# Patient Record
Sex: Female | Born: 1955 | Race: Black or African American | Hispanic: No | Marital: Married | Smoking: Former smoker
Health system: Southern US, Community
[De-identification: ages and names within clinical notes are randomized; demographics above are authoritative.]

## PROBLEM LIST (undated history)

## (undated) DIAGNOSIS — G5600 Carpal tunnel syndrome, unspecified upper limb: Secondary | ICD-10-CM

## (undated) DIAGNOSIS — G4733 Obstructive sleep apnea (adult) (pediatric): Secondary | ICD-10-CM

## (undated) DIAGNOSIS — M67919 Unspecified disorder of synovium and tendon, unspecified shoulder: Secondary | ICD-10-CM

## (undated) DIAGNOSIS — E785 Hyperlipidemia, unspecified: Secondary | ICD-10-CM

## (undated) DIAGNOSIS — E2839 Other primary ovarian failure: Secondary | ICD-10-CM

## (undated) DIAGNOSIS — K219 Gastro-esophageal reflux disease without esophagitis: Secondary | ICD-10-CM

## (undated) DIAGNOSIS — L80 Vitiligo: Secondary | ICD-10-CM

## (undated) DIAGNOSIS — IMO0002 Reserved for concepts with insufficient information to code with codable children: Secondary | ICD-10-CM

## (undated) DIAGNOSIS — M7660 Achilles tendinitis, unspecified leg: Secondary | ICD-10-CM

## (undated) DIAGNOSIS — E119 Type 2 diabetes mellitus without complications: Secondary | ICD-10-CM

## (undated) DIAGNOSIS — I10 Essential (primary) hypertension: Secondary | ICD-10-CM

## (undated) DIAGNOSIS — E669 Obesity, unspecified: Secondary | ICD-10-CM

## (undated) HISTORY — DX: Obstructive sleep apnea (adult) (pediatric): G47.33

## (undated) HISTORY — PX: SUPRACERVICAL ABDOMINAL HYSTERECTOMY: SHX5393

## (undated) HISTORY — DX: Type 2 diabetes mellitus without complications: E11.9

## (undated) HISTORY — DX: Gastro-esophageal reflux disease without esophagitis: K21.9

## (undated) HISTORY — DX: Obesity, unspecified: E66.9

## (undated) HISTORY — DX: Hyperlipidemia, unspecified: E78.5

## (undated) HISTORY — DX: Essential (primary) hypertension: I10

## (undated) HISTORY — DX: Vitiligo: L80

## (undated) HISTORY — DX: Achilles tendinitis, unspecified leg: M76.60

## (undated) HISTORY — DX: Reserved for concepts with insufficient information to code with codable children: IMO0002

## (undated) HISTORY — DX: Carpal tunnel syndrome, unspecified upper limb: G56.00

## (undated) HISTORY — DX: Other primary ovarian failure: E28.39

## (undated) HISTORY — DX: Unspecified disorder of synovium and tendon, unspecified shoulder: M67.919

---

## 1975-09-16 HISTORY — PX: CARDIAC CATHETERIZATION: SHX172

## 2006-07-08 ENCOUNTER — Ambulatory Visit: Payer: Self-pay | Admitting: Family Medicine

## 2006-10-13 ENCOUNTER — Ambulatory Visit: Payer: Self-pay | Admitting: Family Medicine

## 2009-05-16 LAB — HM DEXA SCAN: HM Dexa Scan: NORMAL

## 2009-09-15 LAB — HM COLONOSCOPY

## 2009-10-01 ENCOUNTER — Ambulatory Visit: Payer: Self-pay | Admitting: Gastroenterology

## 2011-02-11 ENCOUNTER — Ambulatory Visit: Payer: Self-pay | Admitting: Family Medicine

## 2011-02-14 ENCOUNTER — Ambulatory Visit: Payer: Self-pay | Admitting: Family Medicine

## 2011-03-16 ENCOUNTER — Ambulatory Visit: Payer: Self-pay | Admitting: Family Medicine

## 2012-01-06 ENCOUNTER — Encounter: Payer: Self-pay | Admitting: *Deleted

## 2012-01-07 ENCOUNTER — Ambulatory Visit: Payer: Self-pay | Admitting: Cardiovascular Disease

## 2012-06-02 ENCOUNTER — Ambulatory Visit: Payer: Self-pay | Admitting: Family Medicine

## 2012-12-14 ENCOUNTER — Ambulatory Visit: Payer: Self-pay | Admitting: Family Medicine

## 2013-01-21 ENCOUNTER — Ambulatory Visit: Payer: Self-pay | Admitting: Family Medicine

## 2013-12-19 ENCOUNTER — Ambulatory Visit: Payer: Self-pay | Admitting: Family Medicine

## 2013-12-19 LAB — HM MAMMOGRAPHY: HM Mammogram: NORMAL

## 2014-01-23 ENCOUNTER — Ambulatory Visit: Payer: Self-pay | Admitting: Cardiovascular Disease

## 2014-02-03 ENCOUNTER — Ambulatory Visit (INDEPENDENT_AMBULATORY_CARE_PROVIDER_SITE_OTHER): Payer: BC Managed Care – PPO | Admitting: Cardiovascular Disease

## 2014-02-03 ENCOUNTER — Encounter (INDEPENDENT_AMBULATORY_CARE_PROVIDER_SITE_OTHER): Payer: Self-pay

## 2014-02-03 ENCOUNTER — Encounter: Payer: Self-pay | Admitting: Cardiovascular Disease

## 2014-02-03 VITALS — BP 152/90 | HR 78 | Ht 64.0 in | Wt 270.0 lb

## 2014-02-03 DIAGNOSIS — R079 Chest pain, unspecified: Secondary | ICD-10-CM

## 2014-02-03 NOTE — Patient Instructions (Signed)
Your physician recommends that you schedule a follow-up appointment in:  As needed  Your physician recommends that you continue on your current medications as directed. Please refer to the Current Medication list given to you today.  

## 2014-02-03 NOTE — Assessment & Plan Note (Signed)
Her episodes of chest pain sounds very atypical.  Her episodes only last for 2 or 3 seconds and typically happen with rest it she does not have any exertional chest pain.  I've encouraged her to start an exercise program. I've asked her to eat a better diet including a low-carb diet. I do not think that I need to see her back on her regular basis but will see her on an as-needed basis.

## 2014-02-03 NOTE — Progress Notes (Signed)
Jennifer Castro Date of Birth  1956-06-18       [facility] [address], [place], [place]  [address]   [place], [place]  [address] [place]   Fax  [phone]     Fax [phone]  Problem List: 1. Chest pain 2. Obesity ( Body mass index is 46.32 kg/(m^2).)    History of Present Illness:  Jennifer Castro is a 58 yo who is referred from Dr. Ancil Castro for CP.  Occurs with rest or activity .  She does not exercise. Not associated with deep breath.  She does think it may be related to eating.   Occurred last night after she had eaten a [employer] hamburger.    The pain will last for a few seconds - never lasted a minute.  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 She does not follow any diet.   She eats fast food frequently.    She lives in [place], [place] and works in [place] at the [employer].    Current Outpatient Prescriptions on File Prior to Visit  Medication Sig Dispense Refill  . amLODipine-atorvastatin (CADUET) 10-40 MG per tablet Take 1 tablet by mouth daily.      Marland Kitchen aspirin 81 MG tablet Take 81 mg by mouth daily.      . calcium carbonate-magnesium hydroxide (ROLAIDS) 334 MG CHEW Chew 1 tablet by mouth as needed.      . diclofenac sodium (VOLTAREN) 1 % GEL Apply topically as needed.       . Fluticasone-Salmeterol (ADVAIR) 250-50 MCG/DOSE AEPB Inhale 1 puff into the lungs as needed.       . metFORMIN (GLUCOPHAGE) 850 MG tablet Take 850 mg by mouth 2 (two) times daily.      Marland Kitchen POLYETHYLENE GLYCOL 3350 PO Take by mouth as needed. Mix with 8oz fluid.      . valsartan-hydrochlorothiazide (DIOVAN-HCT) 320-25 MG per tablet Take 1 tablet by mouth daily.       No current facility-administered medications on file prior to visit.    Allergies  Allergen Reactions  . Ace Inhibitors     Past Medical History  Diagnosis Date  . Hyperlipidemia   . Hypertension   .  Gastroesophageal reflux disease   . Asthma   . Diabetes mellitus type II   . Carpal tunnel syndrome   . Vitiligo   . Hemorrhoids   . Tendonitis, Achilles   . Disorder of rotator cuff   . OSA (obstructive sleep apnea)   . Excess or deficiency of vitamin D   . Ovarian failure   . Obesity     Past Surgical History  Procedure Laterality Date  . Supracervical abdominal hysterectomy      without BSO fibroid tumors  . Cardiac catheterization  [place]    History  Smoking status  . Never Smoker   Smokeless tobacco  . Not on file    History  Alcohol Use No    Family History  Problem Relation Age of Onset  . Heart attack Mother     Reviw of Systems:  Reviewed in the HPI.  All other systems are negative.  Physical Exam: Blood pressure 152/90, pulse 78, height 5\' 4"  (1.626 m), weight 270 lb (122.471 kg). Wt Readings from Last 3 Encounters:  02/03/14 270 lb (122.471 kg)  General: Well developed, well nourished, in no acute distress. Morbidly obese  Head: Normocephalic, atraumatic, sclera non-icteric, mucus membranes are moist,   Neck: Supple. Carotids are 2 + without bruits. No JVD   Lungs: Clear   Heart: RR, normla s1s2  Abdomen: Soft, non-tender, non-distended with normal bowel sounds.  Morbid obesity.  Msk:  Strength and tone are normal   Extremities: No clubbing or cyanosis. No edema.  Distal pedal pulses are 2+ and equal   Neuro: CN II - XII intact.  Alert and oriented X 3.   Psych:  Normal   ECG: Feb 03, 2014:  NSR at 28.  Occasional PVCs. NS ST abnormality.   Assessment / Plan:

## 2014-08-25 LAB — LIPID PANEL
Cholesterol: 171 mg/dL (ref 0–200)
HDL: 54 mg/dL (ref 35–70)
LDL Cholesterol: 109 mg/dL
TRIGLYCERIDES: 42 mg/dL (ref 40–160)

## 2014-10-24 LAB — HM PAP SMEAR: HM Pap smear: NORMAL

## 2014-12-26 LAB — HM DIABETES EYE EXAM

## 2015-02-08 LAB — HEMOGLOBIN A1C: Hgb A1c MFr Bld: 6.7 % — AB (ref 4.0–6.0)

## 2015-02-19 ENCOUNTER — Other Ambulatory Visit: Payer: Self-pay | Admitting: Family Medicine

## 2015-02-19 DIAGNOSIS — Z1231 Encounter for screening mammogram for malignant neoplasm of breast: Secondary | ICD-10-CM

## 2015-02-22 ENCOUNTER — Ambulatory Visit
Admission: RE | Admit: 2015-02-22 | Discharge: 2015-02-22 | Disposition: A | Discharge status: Home / Self Care | Payer: BLUE CROSS/BLUE SHIELD | Source: Ambulatory Visit | Attending: Family Medicine | Admitting: Family Medicine

## 2015-02-22 DIAGNOSIS — Z1231 Encounter for screening mammogram for malignant neoplasm of breast: Secondary | ICD-10-CM | POA: Insufficient documentation

## 2015-05-22 ENCOUNTER — Ambulatory Visit (INDEPENDENT_AMBULATORY_CARE_PROVIDER_SITE_OTHER): Payer: BLUE CROSS/BLUE SHIELD | Admitting: Family Medicine

## 2015-05-22 ENCOUNTER — Telehealth: Payer: Self-pay | Admitting: Family Medicine

## 2015-05-22 ENCOUNTER — Encounter: Payer: Self-pay | Admitting: Family Medicine

## 2015-05-22 VITALS — BP 158/76 | HR 84 | Temp 98.6°F | Resp 18 | Ht 64.0 in | Wt 285.5 lb

## 2015-05-22 DIAGNOSIS — K219 Gastro-esophageal reflux disease without esophagitis: Secondary | ICD-10-CM | POA: Diagnosis not present

## 2015-05-22 DIAGNOSIS — E559 Vitamin D deficiency, unspecified: Secondary | ICD-10-CM | POA: Insufficient documentation

## 2015-05-22 DIAGNOSIS — K5909 Other constipation: Secondary | ICD-10-CM | POA: Insufficient documentation

## 2015-05-22 DIAGNOSIS — J4541 Moderate persistent asthma with (acute) exacerbation: Secondary | ICD-10-CM | POA: Diagnosis not present

## 2015-05-22 DIAGNOSIS — G4733 Obstructive sleep apnea (adult) (pediatric): Secondary | ICD-10-CM | POA: Insufficient documentation

## 2015-05-22 DIAGNOSIS — J069 Acute upper respiratory infection, unspecified: Secondary | ICD-10-CM

## 2015-05-22 DIAGNOSIS — L80 Vitiligo: Secondary | ICD-10-CM | POA: Insufficient documentation

## 2015-05-22 DIAGNOSIS — E1142 Type 2 diabetes mellitus with diabetic polyneuropathy: Secondary | ICD-10-CM

## 2015-05-22 DIAGNOSIS — I10 Essential (primary) hypertension: Secondary | ICD-10-CM | POA: Diagnosis not present

## 2015-05-22 DIAGNOSIS — J4520 Mild intermittent asthma, uncomplicated: Secondary | ICD-10-CM | POA: Insufficient documentation

## 2015-05-22 DIAGNOSIS — E785 Hyperlipidemia, unspecified: Secondary | ICD-10-CM | POA: Diagnosis not present

## 2015-05-22 DIAGNOSIS — E1140 Type 2 diabetes mellitus with diabetic neuropathy, unspecified: Secondary | ICD-10-CM | POA: Insufficient documentation

## 2015-05-22 LAB — POCT GLYCOSYLATED HEMOGLOBIN (HGB A1C): Hemoglobin A1C: 6.7

## 2015-05-22 MED ORDER — BENZONATATE 100 MG PO CAPS: 100.0000 mg | ORAL_CAPSULE | Freq: Three times a day (TID) | ORAL | Status: DC | PRN

## 2015-05-22 MED ORDER — PREDNISONE 20 MG PO TABS: 20.0000 mg | ORAL_TABLET | Freq: Every day | ORAL | Status: DC

## 2015-05-22 NOTE — Telephone Encounter (Signed)
Is this correct? 

## 2015-05-22 NOTE — Telephone Encounter (Signed)
Patient was seen today and was told by dr Ancil Boozer to take a couple of days off of work. She is requesting a work note and will pick it up when she receive the call that it is ready.

## 2015-05-22 NOTE — Progress Notes (Signed)
Name: Jennifer Castro   MRN: [id number]    DOB: June 07, 1956   Date:05/22/2015       Progress Note  Subjective  Chief Complaint  Chief Complaint  Patient presents with  . Medication Refill    3 month F/U  . Hypertension    Edema in right ankle.  Marland Kitchen URI    Onset-Friday, Fatigue, Dizziness, Headache, Cold chills, Horsenesss, Sore Throat, Body Aches. Has been taking OTC Cold and Sinus and Alka-Seltzer Plus, and symptoms are worsen.    . Diabetes    Checks BS once daily every other day, Low-120's Average-146 High-178  . Constipation    Has been improving, drinking alot of water and eating fiber (fruit)    HPI  HTN: bp is high today, she has been sick with an URI, not sleeping well. BP is usually within normal limits  DM II with neuropathy: she has been taking Metformin and aspirin daily, she has been sucking on Halls for her cough, and glucose went up to 170's but usually below 130.  Denies polyphagia, or polydipsia. She states about one week ago she was having polyuria but it has improved now. Feet pain is still daily but not all day like it used to be  Hyperlipidemia: taking Caduet and denies side effects  URI: feeling sick for the past 6 days. Went to work today, but hoarseness, fatigue and facial pressure and SOB was bothering her all day. Initially sputum was green and now is yellow. No fever  Asthma with acute exacerbation: she was doing well on Advair, but since her URI, she has noticed SOB, cough daily and also wheezing that is worse at night. She has been using rescue inhaler every day for the past 6 days.   GERD: under control  Obesity: gained weight since last visit, states drinking more sweet beverages lately, but will try to go back to water and have better food choices   Patient Active Problem List   Diagnosis Date Noted  . Hypertension, benign 05/22/2015  . Hyperlipidemia 05/22/2015  . GERD (gastroesophageal reflux disease) 05/22/2015  . Diabetes mellitus with neuropathy  05/22/2015  . Moderate persistent asthma with exacerbation 05/22/2015  . OSA (obstructive sleep apnea) 05/22/2015  . Obesity, Class III, BMI 40-49.9 (morbid obesity) 05/22/2015  . Vitamin D deficiency 05/22/2015  . Chronic constipation 05/22/2015  . Vitiligo 05/22/2015  . Chest pain 02/03/2014    Past Surgical History  Procedure Laterality Date  . Supracervical abdominal hysterectomy      without BSO fibroid tumors  . Cardiac catheterization  [place]    Family History  Problem Relation Age of Onset  . Heart attack Mother     Social History   Social History  . Marital Status: Married    Spouse Name: N/A  . Number of Children: N/A  . Years of Education: N/A   Occupational History  . Not on file.   Social History Main Topics  . Smoking status: Former Smoker -- 0.50 packs/day for 10 years    Types: Cigarettes    Start date: 09/15/1969    Quit date: 05/21/1980  . Smokeless tobacco: Never Used  . Alcohol Use: No  . Drug Use: No  . Sexual Activity: Not Currently   Other Topics Concern  . Not on file   Social History Narrative     Current outpatient prescriptions:  .  amLODipine-atorvastatin (CADUET) 10-40 MG per tablet, Take 1 tablet by mouth daily., Disp: , Rfl:  .  aspirin 81 MG tablet, Take 81 mg by mouth daily., Disp: , Rfl:  .  calcium carbonate-magnesium hydroxide (ROLAIDS) 334 MG CHEW, Chew 1 tablet by mouth as needed., Disp: , Rfl:  .  diclofenac sodium (VOLTAREN) 1 % GEL, Apply topically as needed. , Disp: , Rfl:  .  Fluticasone-Salmeterol (ADVAIR) 250-50 MCG/DOSE AEPB, Inhale 1 puff into the lungs as needed. , Disp: , Rfl:  .  metFORMIN (GLUCOPHAGE) 850 MG tablet, Take 850 mg by mouth 2 (two) times daily., Disp: , Rfl:  .  POLYETHYLENE GLYCOL 3350 PO, Take by mouth as needed. Mix with 8oz fluid., Disp: , Rfl:  .  valsartan-hydrochlorothiazide (DIOVAN-HCT) 320-25 MG per tablet, Take 1 tablet by mouth daily., Disp: , Rfl:  .  VITAMIN D,  CHOLECALCIFEROL, PO, Take by mouth daily., Disp: , Rfl:  .  benzonatate (TESSALON) 100 MG capsule, Take 1 capsule (100 mg total) by mouth 3 (three) times daily as needed for cough., Disp: 40 capsule, Rfl: 0 .  predniSONE (DELTASONE) 20 MG tablet, Take 1 tablet (20 mg total) by mouth daily with breakfast., Disp: 10 tablet, Rfl: 0  Allergies  Allergen Reactions  . Ace Inhibitors      ROS  Constitutional: Negative for fever positiive for weight change.  Respiratory: Positive for cough and shortness of breath.   Cardiovascular: Negative for chest pain or palpitations.  Gastrointestinal: Negative for abdominal pain, no bowel changes.  Musculoskeletal: Negative for gait problem or joint swelling.  Skin: Negative for rash.  Neurological: Negative for dizziness or headache.  No other specific complaints in a complete review of systems (except as listed in HPI above).  Objective  Filed Vitals:   05/22/15 1517  BP: 158/76  Pulse: 84  Temp: 98.6 F (37 C)  TempSrc: Oral  Resp: 18  Height: 5\' 4"  (1.626 m)  Weight: 285 lb 8 oz (129.502 kg)  SpO2: 95%    Body mass index is 48.98 kg/(m^2).  Physical Exam  Constitutional: Patient appears well-developed Obese No distress.  HEENT: head atraumatic, normocephalic, pupils equal and reactive to light, ears norma TM, neck supple, white postnasal drainage, tender to palpation of frontal sinus Cardiovascular: Normal rate, regular rhythm and normal heart sounds.  No murmur heard. No BLE edema. Pulmonary/Chest: Effort normal and breath sounds normal. No respiratory distress. Abdominal: Soft.  There is no tenderness. Psychiatric: Patient has a normal mood and affect. behavior is normal. Judgment and thought content normal.   Diabetic Foot Exam: Diabetic Foot Exam - Simple   Simple Foot Form  Visual Inspection  No deformities, no ulcerations, no other skin breakdown bilaterally:  Yes  Sensation Testing  Intact to touch and monofilament  testing bilaterally:  Yes  Pulse Check  Posterior Tibialis and Dorsalis pulse intact bilaterally:  Yes  Comments      PHQ2/9: Depression screen PHQ 2/9 05/22/2015  Decreased Interest 0  Down, Depressed, Hopeless 0  PHQ - 2 Score 0     Fall Risk: Fall Risk  05/22/2015  Falls in the past year? No      Assessment & Plan   1. Type 2 diabetes mellitus with polyneuropathy Discussed importance of diet and exercise. Explained prednisone will make glucose go up, needs to be very strict with her diet over the next week   2. Hypertension, benign Elevated today, avoid otc cold and cough medication, continue bp medication  3. Hyperlipidemia Recheck next visit  4. Gastroesophageal reflux disease without esophagitis controlled  5. Moderate persistent asthma  with exacerbation Flare from URI  - benzonatate (TESSALON) 100 MG capsule; Take 1 capsule (100 mg total) by mouth 3 (three) times daily as needed for cough.  Dispense: 40 capsule; Refill: 0 - predniSONE (DELTASONE) 20 MG tablet; Take 1 tablet (20 mg total) by mouth daily with breakfast.  Dispense: 10 tablet; Refill:   6. Obesity, Class III, BMI 40-49.9 (morbid obesity) Discussed with the patient the risk posed by an increased BMI. Discussed importance of portion control, calorie counting and at least 150 minutes of physical activity weekly. Avoid sweet beverages and drink more water. Eat at least 6 servings of fruit and vegetables daily   7. Vitamin D deficiency Continue otc supplementation   8. Upper respiratory infection Saline spray, time off work, rest, fluids, and medication as written, call back if no improvement and worsening of symptoms for antibiotic prescription  -Tessalon perles 100 mg #40  - benzonatate (TESSALON) 100 MG capsule; Take 1 capsule (100 mg total) by mouth 3 (three) times daily as needed for cough.  Dispense: 40 capsule; Refill: 0 - predniSONE (DELTASONE) 20 MG tablet; Take 1 tablet (20 mg total) by mouth  daily with breakfast.  Dispense: 10 tablet; Refill:

## 2015-05-22 NOTE — Telephone Encounter (Signed)
Yes. She can stay out Wednesday and Thursday

## 2015-05-23 NOTE — Telephone Encounter (Signed)
Called left vm work letter ready to pick up

## 2015-06-26 ENCOUNTER — Telehealth: Payer: Self-pay | Admitting: Family Medicine

## 2015-06-26 NOTE — Telephone Encounter (Signed)
Was told to take predinose for 3-4 days (from a different time-it hasnt been over 30 days), she have 6 pills left. She has the same symptoms as before and would like to know if she is able to finish out that medications? It is okay to leave detailed message [phone]

## 2015-08-15 ENCOUNTER — Ambulatory Visit (INDEPENDENT_AMBULATORY_CARE_PROVIDER_SITE_OTHER): Payer: BLUE CROSS/BLUE SHIELD | Admitting: Family Medicine

## 2015-08-15 ENCOUNTER — Encounter: Payer: Self-pay | Admitting: Family Medicine

## 2015-08-15 VITALS — BP 132/84 | HR 87 | Temp 98.6°F | Resp 16 | Wt 288.7 lb

## 2015-08-15 DIAGNOSIS — J309 Allergic rhinitis, unspecified: Secondary | ICD-10-CM

## 2015-08-15 DIAGNOSIS — J3089 Other allergic rhinitis: Secondary | ICD-10-CM | POA: Insufficient documentation

## 2015-08-15 DIAGNOSIS — J069 Acute upper respiratory infection, unspecified: Secondary | ICD-10-CM

## 2015-08-15 DIAGNOSIS — R0609 Other forms of dyspnea: Secondary | ICD-10-CM | POA: Diagnosis not present

## 2015-08-15 DIAGNOSIS — E785 Hyperlipidemia, unspecified: Secondary | ICD-10-CM

## 2015-08-15 DIAGNOSIS — J4541 Moderate persistent asthma with (acute) exacerbation: Secondary | ICD-10-CM

## 2015-08-15 DIAGNOSIS — I10 Essential (primary) hypertension: Secondary | ICD-10-CM

## 2015-08-15 DIAGNOSIS — E559 Vitamin D deficiency, unspecified: Secondary | ICD-10-CM

## 2015-08-15 MED ORDER — MONTELUKAST SODIUM 10 MG PO TABS: 10.0000 mg | ORAL_TABLET | Freq: Every day | ORAL | Status: DC

## 2015-08-15 MED ORDER — PREDNISONE 20 MG PO TABS: 20.0000 mg | ORAL_TABLET | Freq: Every day | ORAL | Status: DC

## 2015-08-15 MED ORDER — FLUTICASONE PROPIONATE 50 MCG/ACT NA SUSP: 2.0000 | Freq: Every day | NASAL | Status: DC

## 2015-08-15 MED ORDER — ALBUTEROL SULFATE HFA 108 (90 BASE) MCG/ACT IN AERS: 2.0000 | INHALATION_SPRAY | Freq: Four times a day (QID) | RESPIRATORY_TRACT | Status: AC | PRN

## 2015-08-15 MED ORDER — PREDNISONE 20 MG PO TABS: 20.0000 mg | ORAL_TABLET | Freq: Two times a day (BID) | ORAL | Status: DC

## 2015-08-15 MED ORDER — BENZONATATE 100 MG PO CAPS: 100.0000 mg | ORAL_CAPSULE | Freq: Three times a day (TID) | ORAL | Status: DC | PRN

## 2015-08-15 NOTE — Progress Notes (Signed)
Name: Jennifer Castro   MRN: [id number]    DOB: [birth year]   Date:08/15/2015       Progress Note  Subjective  Chief Complaint  Chief Complaint  Patient presents with  . URI    ongoing several months.  having cough, sob, wheezing, headache, and loss of voice    HPI  URI/AR: she states that for the past few months she has constant nasal congestion, clear rhinorrhea, hoarseness, and worsening of her cough. Intermittent episodes of SOB  AR: she states that for most of the year she has noticed nasal congestion, sneezing and clear rhinorrhea.  Asthma moderate persistent with exacerbation: she is compliant with her medication and has increased the use of Ventolin for the past week. She states the cough is currently dry, waking up during the night because of the cough. SOB intermittently.    Patient Active Problem List   Diagnosis Date Noted  . Hypertension, benign 05/22/2015  . Hyperlipidemia 05/22/2015  . GERD (gastroesophageal reflux disease) 05/22/2015  . Diabetes mellitus with neuropathy ([place]) 05/22/2015  . Moderate persistent asthma with exacerbation 05/22/2015  . OSA (obstructive sleep apnea) 05/22/2015  . Obesity, Class III, BMI 40-49.9 (morbid obesity) ([place]) 05/22/2015  . Vitamin D deficiency 05/22/2015  . Chronic constipation 05/22/2015  . Vitiligo 05/22/2015  . Chest pain 02/03/2014    Past Surgical History  Procedure Laterality Date  . Supracervical abdominal hysterectomy      without BSO fibroid tumors  . Cardiac catheterization  [place]    Family History  Problem Relation Age of Onset  . Heart attack Mother     Social History   Social History  . Marital Status: Married    Spouse Name: N/A  . Number of Children: N/A  . Years of Education: N/A   Occupational History  . Not on file.   Social History Main Topics  . Smoking status: Former Smoker -- 0.50 packs/day for 10 years    Types: Cigarettes    Start date: 09/15/1969    Quit date:  05/21/1980  . Smokeless tobacco: Never Used  . Alcohol Use: No  . Drug Use: No  . Sexual Activity: Not Currently   Other Topics Concern  . Not on file   Social History Narrative     Current outpatient prescriptions:  .  amLODipine-atorvastatin (CADUET) 10-40 MG per tablet, Take 1 tablet by mouth daily., Disp: , Rfl:  .  aspirin 81 MG tablet, Take 81 mg by mouth daily., Disp: , Rfl:  .  benzonatate (TESSALON) 100 MG capsule, Take 1 capsule (100 mg total) by mouth 3 (three) times daily as needed for cough., Disp: 40 capsule, Rfl: 0 .  calcium carbonate-magnesium hydroxide (ROLAIDS) 334 MG CHEW, Chew 1 tablet by mouth as needed., Disp: , Rfl:  .  diclofenac sodium (VOLTAREN) 1 % GEL, Apply topically as needed. , Disp: , Rfl:  .  Fluticasone-Salmeterol (ADVAIR) 250-50 MCG/DOSE AEPB, Inhale 1 puff into the lungs as needed. , Disp: , Rfl:  .  metFORMIN (GLUCOPHAGE) 850 MG tablet, Take 850 mg by mouth 2 (two) times daily., Disp: , Rfl:  .  POLYETHYLENE GLYCOL 3350 PO, Take by mouth as needed. Mix with 8oz fluid., Disp: , Rfl:  .  predniSONE (DELTASONE) 20 MG tablet, Take 1 tablet (20 mg total) by mouth daily with breakfast., Disp: 10 tablet, Rfl: 0 .  valsartan-hydrochlorothiazide (DIOVAN-HCT) 320-25 MG per tablet, Take 1 tablet by mouth daily., Disp: , Rfl:  .  VITAMIN D, CHOLECALCIFEROL, PO, Take by mouth daily., Disp: , Rfl:   Allergies  Allergen Reactions  . Ace Inhibitors      ROS  Constitutional: Negative for fever or weight change.  Respiratory: Positive  for cough and shortness of breath.   Cardiovascular: Positive  for chest pain intermittent with the severe cough, and  Palpitations intermittently .  Gastrointestinal: Negative for abdominal pain, no bowel changes.  Musculoskeletal: Positive  for gait problem - from heel spurs - going to see podiatrist or joint swelling.  Skin: Negative for rash.  Neurological: Nepisodes of headache and dizziness with cough .  No other  specific complaints in a complete review of systems (except as listed in HPI above).  Objective  Filed Vitals:   08/15/15 1104  BP: 132/84  Pulse: 87  Temp: 98.6 F (37 C)  TempSrc: Oral  Resp: 16  Weight: 288 lb 11.2 oz (130.953 kg)  SpO2: 99%    Body mass index is 49.53 kg/(m^2).  Physical Exam  Constitutional: Patient appears well-developed and well-nourished. Obese  No distress.  HEENT: head atraumatic, normocephalic,boggy turbinates with clear rhinorrhea,  pupils equal and reactive to light, ears TM normal, neck supple, throat within normal limits Cardiovascular: Normal rate, regular rhythm and normal heart sounds.  No murmur heard. 1 plus  BLE edema. Pulmonary/Chest: Effort normal and breath sounds normal. No respiratory distress. Abdominal: Soft.  There is no tenderness. Psychiatric: Patient has a normal mood and affect. behavior is normal. Judgment and thought content normal.  Recent Results (from the past 2160 hour(s))  POCT HgB A1C     Status: None   Collection Time: 05/22/15  4:06 PM  Result Value Ref Range   Hemoglobin A1C 6.7     PHQ2/9: Depression screen [facility] 2/9 08/15/2015 05/22/2015  Decreased Interest 0 0  Down, Depressed, Hopeless 0 0  PHQ - 2 Score 0 0    Fall Risk: Fall Risk  08/15/2015 05/22/2015  Falls in the past year? No No    Functional Status Survey: Is the patient deaf or have difficulty hearing?: No Does the patient have difficulty seeing, even when wearing glasses/contacts?: Yes (glasses) Does the patient have difficulty concentrating, remembering, or making decisions?: No Does the patient have difficulty walking or climbing stairs?: No Does the patient have difficulty dressing or bathing?: No Does the patient have difficulty doing errands alone such as visiting a doctor's office or shopping?: No    Assessment & Plan  1. Moderate persistent asthma with exacerbation  I will refer her to Allergist if no improvement of symptoms.  -  albuterol (PROVENTIL HFA;VENTOLIN HFA) 108 (90 BASE) MCG/ACT inhaler; Inhale 2 puffs into the lungs every 6 (six) hours as needed for wheezing or shortness of breath.  Dispense: 1 Inhaler; Refill: 0 - montelukast (SINGULAIR) 10 MG tablet; Take 1 tablet (10 mg total) by mouth at bedtime.  Dispense: 30 tablet; Refill: 5 - predniSONE (DELTASONE) 20 MG tablet; Take 1 tablet (20 mg total) by mouth 2 (two) times daily with a meal.  Dispense: 10 tablet; Refill: 0  2. Upper respiratory infection  - benzonatate (TESSALON) 100 MG capsule; Take 1 capsule (100 mg total) by mouth 3 (three) times daily as needed for cough.  Dispense: 40 capsule; Refill: 0  3. Vitamin D deficiency  - VITAMIN D 25 Hydroxy (Vit-D Deficiency, Fractures)  4. Hyperlipidemia  - Lipid panel  5. Hypertension, benign  - Comprehensive metabolic panel  6. Perennial allergic rhinitis  - fluticasone (  FLONASE) 50 MCG/ACT nasal spray; Place 2 sprays into both nostrils daily.  Dispense: 16 g; Refill: 6 - montelukast (SINGULAIR) 10 MG tablet; Take 1 tablet (10 mg total) by mouth at bedtime.  Dispense: 30 tablet; Refill: 5  7. Dyspnea on exertion  - B Nat Peptide We will get bnap, because of her risk factor to make sure not in CHF

## 2015-08-16 ENCOUNTER — Other Ambulatory Visit: Payer: Self-pay | Admitting: Family Medicine

## 2015-08-16 DIAGNOSIS — E559 Vitamin D deficiency, unspecified: Secondary | ICD-10-CM

## 2015-08-16 LAB — COMPREHENSIVE METABOLIC PANEL
ALBUMIN: 3.9 g/dL (ref 3.6–4.8)
ALK PHOS: 83 IU/L (ref 39–117)
ALT: 14 IU/L (ref 0–32)
AST: 14 IU/L (ref 0–40)
Albumin/Globulin Ratio: 1.2 (ref 1.1–2.5)
BILIRUBIN TOTAL: 0.4 mg/dL (ref 0.0–1.2)
BUN / CREAT RATIO: 16 (ref 11–26)
BUN: 10 mg/dL (ref 8–27)
CO2: 28 mmol/L (ref 18–29)
CREATININE: 0.64 mg/dL (ref 0.57–1.00)
Calcium: 9.3 mg/dL (ref 8.7–10.3)
Chloride: 100 mmol/L (ref 97–106)
GFR calc Af Amer: 112 mL/min/{1.73_m2} (ref >59)
GFR, EST NON AFRICAN AMERICAN: 97 mL/min/{1.73_m2} (ref >59)
GLOBULIN, TOTAL: 3.2 g/dL (ref 1.5–4.5)
GLUCOSE: 119 mg/dL — AB (ref 65–99)
Potassium: 4.1 mmol/L (ref 3.5–5.2)
SODIUM: 140 mmol/L (ref 136–144)
Total Protein: 7.1 g/dL (ref 6.0–8.5)

## 2015-08-16 LAB — LIPID PANEL
Chol/HDL Ratio: 3.8 ratio units (ref 0.0–4.4)
Cholesterol, Total: 181 mg/dL (ref 100–199)
HDL: 48 mg/dL (ref >39)
LDL CALC: 122 mg/dL — AB (ref 0–99)
Triglycerides: 54 mg/dL (ref 0–149)
VLDL CHOLESTEROL CAL: 11 mg/dL (ref 5–40)

## 2015-08-16 LAB — BRAIN NATRIURETIC PEPTIDE: BNP: 21.7 pg/mL (ref 0.0–100.0)

## 2015-08-16 LAB — VITAMIN D 25 HYDROXY (VIT D DEFICIENCY, FRACTURES): Vit D, 25-Hydroxy: 16.4 ng/mL — ABNORMAL LOW (ref 30.0–100.0)

## 2015-08-16 MED ORDER — VITAMIN D (ERGOCALCIFEROL) 1.25 MG (50000 UNIT) PO CAPS: 50000.0000 [IU] | ORAL_CAPSULE | ORAL | Status: DC

## 2015-08-22 ENCOUNTER — Ambulatory Visit (INDEPENDENT_AMBULATORY_CARE_PROVIDER_SITE_OTHER): Payer: BLUE CROSS/BLUE SHIELD | Admitting: Family Medicine

## 2015-08-22 ENCOUNTER — Encounter: Payer: Self-pay | Admitting: Family Medicine

## 2015-08-22 VITALS — BP 138/78 | HR 95 | Temp 97.7°F | Resp 16 | Ht 64.0 in | Wt 283.5 lb

## 2015-08-22 DIAGNOSIS — E785 Hyperlipidemia, unspecified: Secondary | ICD-10-CM

## 2015-08-22 DIAGNOSIS — R809 Proteinuria, unspecified: Secondary | ICD-10-CM

## 2015-08-22 DIAGNOSIS — Z1211 Encounter for screening for malignant neoplasm of colon: Secondary | ICD-10-CM | POA: Diagnosis not present

## 2015-08-22 DIAGNOSIS — E1142 Type 2 diabetes mellitus with diabetic polyneuropathy: Secondary | ICD-10-CM

## 2015-08-22 DIAGNOSIS — G4733 Obstructive sleep apnea (adult) (pediatric): Secondary | ICD-10-CM

## 2015-08-22 DIAGNOSIS — I10 Essential (primary) hypertension: Secondary | ICD-10-CM

## 2015-08-22 DIAGNOSIS — J4520 Mild intermittent asthma, uncomplicated: Secondary | ICD-10-CM | POA: Diagnosis not present

## 2015-08-22 DIAGNOSIS — E1129 Type 2 diabetes mellitus with other diabetic kidney complication: Secondary | ICD-10-CM | POA: Diagnosis not present

## 2015-08-22 DIAGNOSIS — E66813 Obesity, class 3: Secondary | ICD-10-CM

## 2015-08-22 DIAGNOSIS — IMO0001 Reserved for inherently not codable concepts without codable children: Secondary | ICD-10-CM

## 2015-08-22 DIAGNOSIS — E559 Vitamin D deficiency, unspecified: Secondary | ICD-10-CM | POA: Diagnosis not present

## 2015-08-22 LAB — POCT GLYCOSYLATED HEMOGLOBIN (HGB A1C): HEMOGLOBIN A1C: 6.8

## 2015-08-22 LAB — POCT UA - MICROALBUMIN: MICROALBUMIN (UR) POC: 50 mg/L

## 2015-08-22 NOTE — Progress Notes (Addendum)
Name: Jennifer Castro   MRN: [id number]    DOB: 1956/05/02   Date:08/22/2015       Progress Note  Subjective  Chief Complaint  Chief Complaint  Patient presents with  . Medication Refill    follow-up  . Hypertension    headache may be due to prednisone  . Diabetes  . Asthma    HPI  Asthma moderate Intermittent: she has episodes intermittently, this past year had to take prednisone three times for acute exacerbation during an URI.  Usually only uses rescue inhaler when exposed to fumes or strong odor. Currently taking prednisone. Today would be the 4 th day, but she is feeling much better and we will stop medication. Continue  Advair for at least another week to make sure symptoms completely resolved.    08/22/15 0839  Asthma History  Symptoms 0-2 days/week  Nighttime Awakenings 0-2/month  Asthma interference with normal activity Minor limitations  SABA use (not for EIB) 0-2 days/wk  Risk: Exacerbations requiring oral systemic steroids 2 or more / year  Asthma Severity Intermittent   HTN: she has been back on Caduet daily, but was skipping doses before. Taking Valsartan daily. BP is at goal. Explained importance of compliance with medication. No chest pain or palpitation - except during coughing spells last week.   DM: she has neuropathy and also proteinuria. On ARB, paresthesia on her right foot has improved. She is on Metformin daily. No polyphagia, polydipsia or polyuria. HgbA1C is at goal at 6.8%.   Morbid Obesity: she has lost 6 lbs since last visit one week ago, she states prednisone made her feel jittery and a little nauseated and she has not been eating as much. Advised her to keep weight down.    Patient Active Problem List   Diagnosis Date Noted  . Microalbuminuria due to type 2 diabetes mellitus ([place]) 08/22/2015  . Perennial allergic rhinitis 08/15/2015  . Hypertension, benign 05/22/2015  . Hyperlipidemia 05/22/2015  . GERD (gastroesophageal reflux disease) 05/22/2015   . Diabetes mellitus with neuropathy ([place]) 05/22/2015  . Moderate intermittent asthma 05/22/2015  . OSA (obstructive sleep apnea) 05/22/2015  . Obesity, Class III, BMI 40-49.9 (morbid obesity) ([place]) 05/22/2015  . Vitamin D deficiency 05/22/2015  . Chronic constipation 05/22/2015  . Vitiligo 05/22/2015    Past Surgical History  Procedure Laterality Date  . Supracervical abdominal hysterectomy      without BSO fibroid tumors  . Cardiac catheterization  [place]    Family History  Problem Relation Age of Onset  . Heart attack Mother     Social History   Social History  . Marital Status: Married    Spouse Name: N/A  . Number of Children: N/A  . Years of Education: N/A   Occupational History  . Not on file.   Social History Main Topics  . Smoking status: Former Smoker -- 0.50 packs/day for 10 years    Types: Cigarettes    Start date: 09/15/1969    Quit date: 05/21/1980  . Smokeless tobacco: Never Used  . Alcohol Use: No  . Drug Use: No  . Sexual Activity: Not Currently   Other Topics Concern  . Not on file   Social History Narrative     Current outpatient prescriptions:  .  albuterol (PROVENTIL HFA;VENTOLIN HFA) 108 (90 BASE) MCG/ACT inhaler, Inhale 2 puffs into the lungs every 6 (six) hours as needed for wheezing or shortness of breath., Disp: 1 Inhaler, Rfl: 0 .  amLODipine-atorvastatin (CADUET) 10-40 MG per tablet, Take 1 tablet by mouth daily., Disp: , Rfl:  .  aspirin 81 MG tablet, Take 81 mg by mouth daily., Disp: , Rfl:  .  benzonatate (TESSALON) 100 MG capsule, Take 1 capsule (100 mg total) by mouth 3 (three) times daily as needed for cough., Disp: 40 capsule, Rfl: 0 .  calcium carbonate-magnesium hydroxide (ROLAIDS) 334 MG CHEW, Chew 1 tablet by mouth as needed., Disp: , Rfl:  .  diclofenac sodium (VOLTAREN) 1 % GEL, Apply topically as needed. , Disp: , Rfl:  .  fluticasone (FLONASE) 50 MCG/ACT nasal spray, Place 2 sprays into both nostrils  daily., Disp: 16 g, Rfl: 6 .  Fluticasone-Salmeterol (ADVAIR) 250-50 MCG/DOSE AEPB, Inhale 1 puff into the lungs as needed. , Disp: , Rfl:  .  metFORMIN (GLUCOPHAGE) 850 MG tablet, Take 850 mg by mouth 2 (two) times daily., Disp: , Rfl:  .  montelukast (SINGULAIR) 10 MG tablet, Take 1 tablet (10 mg total) by mouth at bedtime., Disp: 30 tablet, Rfl: 5 .  POLYETHYLENE GLYCOL 3350 PO, Take by mouth as needed. Mix with 8oz fluid., Disp: , Rfl:  .  predniSONE (DELTASONE) 20 MG tablet, Take 1 tablet (20 mg total) by mouth 2 (two) times daily with a meal., Disp: 10 tablet, Rfl: 0 .  valsartan-hydrochlorothiazide (DIOVAN-HCT) 320-25 MG per tablet, Take 1 tablet by mouth daily., Disp: , Rfl:  .  VITAMIN D, CHOLECALCIFEROL, PO, Take by mouth daily., Disp: , Rfl:  .  Vitamin D, Ergocalciferol, (DRISDOL) 50000 UNITS CAPS capsule, Take 1 capsule (50,000 Units total) by mouth every 7 (seven) days., Disp: 12 capsule, Rfl: 0  Allergies  Allergen Reactions  . Ace Inhibitors      ROS  Constitutional: Negative for fever , positive for  weight change - loss.  Respiratory: Negative for cough and shortness of breath.   Cardiovascular: Negative for chest pain or palpitations.  Gastrointestinal: Negative for abdominal pain, no bowel changes.  Musculoskeletal: Negative for gait problem or joint swelling.  Skin: Negative for rash.  Neurological: Negative for dizziness or headache.  No other specific complaints in a complete review of systems (except as listed in HPI above).  Objective  Filed Vitals:   08/22/15 0824  BP: 138/78  Pulse: 95  Temp: 97.7 F (36.5 C)  TempSrc: Oral  Resp: 16  Height: 5\' 4"  (1.626 m)  Weight: 283 lb 8 oz (128.595 kg)  SpO2: 98%    Body mass index is 48.64 kg/(m^2).  Physical Exam  Constitutional: Patient appears well-developed and well-nourished. Obese  No distress.  HEENT: head atraumatic, normocephalic, pupils equal and reactive to light,  neck supple, throat within  normal limits Cardiovascular: Normal rate, regular rhythm and normal heart sounds.  No murmur heard. No BLE edema. Pulmonary/Chest: Effort normal and breath sounds normal. No respiratory distress. Abdominal: Soft.  There is no tenderness. Psychiatric: Patient has a normal mood and affect. behavior is normal. Judgment and thought content normal.  Recent Results (from the past 2160 hour(s))  Lipid panel     Status: Abnormal   Collection Time: 08/15/15 11:46 AM  Result Value Ref Range   Cholesterol, Total 181 100 - 199 mg/dL   Triglycerides 54 0 - 149 mg/dL   HDL 48 >39 mg/dL   VLDL Cholesterol Cal 11 5 - 40 mg/dL   LDL Calculated 122 (H) 0 - 99 mg/dL   Chol/HDL Ratio 3.8 0.0 - 4.4 ratio units    Comment:  T. Chol/HDL Ratio                                             Men  Women                               1/2 Avg.Risk  3.4    3.3                                   Avg.Risk  5.0    4.4                                2X Avg.Risk  9.6    7.1                                3X Avg.Risk 23.4   11.0   Comprehensive metabolic panel     Status: Abnormal   Collection Time: 08/15/15 11:46 AM  Result Value Ref Range   Glucose 119 (H) 65 - 99 mg/dL   BUN 10 8 - 27 mg/dL   Creatinine, Ser 0.64 0.57 - 1.00 mg/dL   GFR calc non Af Amer 97 >59 mL/min/1.73   GFR calc Af Amer 112 >59 mL/min/1.73   BUN/Creatinine Ratio 16 11 - 26   Sodium 140 136 - 144 mmol/L    Comment: **Effective August 27, 2015 the reference interval**   for Sodium, Serum will be changing to:                                             134 - 144    Potassium 4.1 3.5 - 5.2 mmol/L   Chloride 100 97 - 106 mmol/L    Comment: **Effective August 27, 2015 the reference interval**   for Chloride, Serum will be changing to:                                              96 - 106    CO2 28 18 - 29 mmol/L   Calcium 9.3 8.7 - 10.3 mg/dL   Total Protein 7.1 6.0 - 8.5 g/dL   Albumin 3.9 3.6 - 4.8 g/dL    Globulin, Total 3.2 1.5 - 4.5 g/dL   Albumin/Globulin Ratio 1.2 1.1 - 2.5   Bilirubin Total 0.4 0.0 - 1.2 mg/dL   Alkaline Phosphatase 83 39 - 117 IU/L   AST 14 0 - 40 IU/L   ALT 14 0 - 32 IU/L  VITAMIN D 25 Hydroxy (Vit-D Deficiency, Fractures)     Status: Abnormal   Collection Time: 08/15/15 11:46 AM  Result Value Ref Range   Vit D, 25-Hydroxy 16.4 (L) 30.0 - 100.0 ng/mL    Comment: Vitamin D deficiency has been defined by the [place] and an Endocrine Society practice guideline as a level of serum 25-OH vitamin D less than 20 ng/mL (1,2). The Endocrine  Society went on to further define vitamin D insufficiency as a level between 21 and 29 ng/mL (2). 1. IOM (Institute of Medicine). 2010. Dietary reference    intakes for calcium and D. [place]: The    [employer]. 2. Holick MF, Binkley [place], Bischoff-Ferrari HA, et al.    Evaluation, treatment, and prevention of vitamin D    deficiency: an Endocrine Society clinical practice    guideline. JCEM. 2011 Jul; 96(7):1911-30.   Brain natriuretic peptide     Status: None   Collection Time: 08/15/15 11:46 AM  Result Value Ref Range   BNP 21.7 0.0 - 100.0 pg/mL  POCT UA - Microalbumin     Status: Abnormal   Collection Time: 08/22/15  8:34 AM  Result Value Ref Range   Microalbumin Ur, POC 50 mg/L   Creatinine, POC  mg/dL   Albumin/Creatinine Ratio, Urine, POC     PHQ2/9: Depression screen [facility] 2/9 08/15/2015 05/22/2015  Decreased Interest 0 0  Down, Depressed, Hopeless 0 0  PHQ - 2 Score 0 0     Fall Risk: Fall Risk  08/15/2015 05/22/2015  Falls in the past year? No No    Assessment & Plan  1. Diabetic polyneuropathy associated with type 2 diabetes mellitus ([place])  Discussed importance of taking medications daily, and following a diet - POCT HgB A1C - POCT UA - Microalbumin  2. Microalbuminuria due to type 2 diabetes mellitus ([facility])  Continue ARB  3. Hypertension, benign  She has medication  at home, she will call when she needs medication refills  4. Moderate intermittent asthma, uncomplicated  May stop oral prednisone - causing nausea /denies blood in stools or epigastric pain - needs to continue Advair for the next couple of weeks  5. OSA (obstructive sleep apnea)  She needs to go back for sleep study, not wearing a machine. She states she will try colonoscopy first.   6. Obesity, Class III, BMI 40-49.9 (morbid obesity) ([place])  Discussed with the patient the risk posed by an increased BMI. Discussed importance of portion control, calorie counting and at least 150 minutes of physical activity weekly. Avoid sweet beverages and drink more water. Eat at least 6 servings of fruit and vegetables daily   7. Hyperlipidemia  She is back on Caduet daily, LDL was high but she was not taking medication daily   9. Colon cancer screening  - Ambulatory referral to Gastroenterology

## 2015-11-08 ENCOUNTER — Encounter: Payer: Self-pay | Admitting: *Deleted

## 2015-11-09 ENCOUNTER — Encounter: Payer: Self-pay | Admitting: Anesthesiology

## 2015-11-09 ENCOUNTER — Ambulatory Visit: Payer: BLUE CROSS/BLUE SHIELD | Admitting: Anesthesiology

## 2015-11-09 ENCOUNTER — Ambulatory Visit
Admission: RE | Admit: 2015-11-09 | Discharge: 2015-11-09 | Disposition: A | Discharge status: Home / Self Care | Payer: BLUE CROSS/BLUE SHIELD | Source: Ambulatory Visit | Attending: Gastroenterology | Admitting: Gastroenterology

## 2015-11-09 ENCOUNTER — Encounter
Admission: RE | Disposition: A | Discharge status: Home / Self Care | Payer: Self-pay | Source: Ambulatory Visit | Attending: Gastroenterology

## 2015-11-09 DIAGNOSIS — L80 Vitiligo: Secondary | ICD-10-CM | POA: Diagnosis not present

## 2015-11-09 DIAGNOSIS — Z7984 Long term (current) use of oral hypoglycemic drugs: Secondary | ICD-10-CM | POA: Diagnosis not present

## 2015-11-09 DIAGNOSIS — Z1211 Encounter for screening for malignant neoplasm of colon: Secondary | ICD-10-CM | POA: Diagnosis not present

## 2015-11-09 DIAGNOSIS — E119 Type 2 diabetes mellitus without complications: Secondary | ICD-10-CM | POA: Diagnosis not present

## 2015-11-09 DIAGNOSIS — K5730 Diverticulosis of large intestine without perforation or abscess without bleeding: Secondary | ICD-10-CM | POA: Diagnosis not present

## 2015-11-09 DIAGNOSIS — N289 Disorder of kidney and ureter, unspecified: Secondary | ICD-10-CM | POA: Insufficient documentation

## 2015-11-09 DIAGNOSIS — E2839 Other primary ovarian failure: Secondary | ICD-10-CM | POA: Diagnosis not present

## 2015-11-09 DIAGNOSIS — I10 Essential (primary) hypertension: Secondary | ICD-10-CM | POA: Insufficient documentation

## 2015-11-09 DIAGNOSIS — Z79899 Other long term (current) drug therapy: Secondary | ICD-10-CM | POA: Insufficient documentation

## 2015-11-09 DIAGNOSIS — Z7982 Long term (current) use of aspirin: Secondary | ICD-10-CM | POA: Diagnosis not present

## 2015-11-09 DIAGNOSIS — E669 Obesity, unspecified: Secondary | ICD-10-CM | POA: Insufficient documentation

## 2015-11-09 DIAGNOSIS — K219 Gastro-esophageal reflux disease without esophagitis: Secondary | ICD-10-CM | POA: Diagnosis not present

## 2015-11-09 DIAGNOSIS — Z90710 Acquired absence of both cervix and uterus: Secondary | ICD-10-CM | POA: Insufficient documentation

## 2015-11-09 DIAGNOSIS — Z8249 Family history of ischemic heart disease and other diseases of the circulatory system: Secondary | ICD-10-CM | POA: Diagnosis not present

## 2015-11-09 DIAGNOSIS — Z7951 Long term (current) use of inhaled steroids: Secondary | ICD-10-CM | POA: Insufficient documentation

## 2015-11-09 DIAGNOSIS — Z6841 Body Mass Index (BMI) 40.0 and over, adult: Secondary | ICD-10-CM | POA: Insufficient documentation

## 2015-11-09 DIAGNOSIS — J45909 Unspecified asthma, uncomplicated: Secondary | ICD-10-CM | POA: Diagnosis not present

## 2015-11-09 DIAGNOSIS — D124 Benign neoplasm of descending colon: Secondary | ICD-10-CM | POA: Insufficient documentation

## 2015-11-09 DIAGNOSIS — E785 Hyperlipidemia, unspecified: Secondary | ICD-10-CM | POA: Diagnosis not present

## 2015-11-09 DIAGNOSIS — Z87891 Personal history of nicotine dependence: Secondary | ICD-10-CM | POA: Insufficient documentation

## 2015-11-09 DIAGNOSIS — Z86010 Personal history of colonic polyps: Secondary | ICD-10-CM | POA: Diagnosis present

## 2015-11-09 DIAGNOSIS — G4733 Obstructive sleep apnea (adult) (pediatric): Secondary | ICD-10-CM | POA: Insufficient documentation

## 2015-11-09 DIAGNOSIS — D123 Benign neoplasm of transverse colon: Secondary | ICD-10-CM | POA: Diagnosis not present

## 2015-11-09 HISTORY — PX: COLONOSCOPY WITH PROPOFOL: SHX5780

## 2015-11-09 LAB — GLUCOSE, CAPILLARY: GLUCOSE-CAPILLARY: 96 mg/dL (ref 65–99)

## 2015-11-09 LAB — HM COLONOSCOPY

## 2015-11-09 SURGERY — COLONOSCOPY WITH PROPOFOL
Anesthesia: General

## 2015-11-09 MED ORDER — PROPOFOL 500 MG/50ML IV EMUL
INTRAVENOUS | Status: DC | PRN
  Administered 2015-11-09: 140 ug/kg/min via INTRAVENOUS

## 2015-11-09 MED ORDER — MIDAZOLAM HCL 2 MG/2ML IJ SOLN
INTRAMUSCULAR | Status: DC | PRN
Start: 2015-11-09 — End: 2015-11-09
  Administered 2015-11-09: 1 mg via INTRAVENOUS

## 2015-11-09 MED ORDER — LIDOCAINE HCL (CARDIAC) 20 MG/ML IV SOLN
INTRAVENOUS | Status: DC | PRN
  Administered 2015-11-09: 80 mg via INTRAVENOUS

## 2015-11-09 MED ORDER — SODIUM CHLORIDE 0.9 % IV SOLN
INTRAVENOUS | Status: DC
  Administered 2015-11-09: 1000 mL via INTRAVENOUS

## 2015-11-09 NOTE — Transfer of Care (Signed)
Immediate Anesthesia Transfer of Care Note  Patient: Jennifer Castro  Procedure(s) Performed: Procedure(s): COLONOSCOPY WITH PROPOFOL (N/A)  Patient Location: PACU  Anesthesia Type:General  Level of Consciousness: awake, alert  and oriented  Airway & Oxygen Therapy: Patient Spontanous Breathing and Patient connected to nasal cannula oxygen  Post-op Assessment: Report given to RN and Post -op Vital signs reviewed and stable  Post vital signs: Reviewed and stable  Last Vitals:  Filed Vitals:   11/09/15 1009 11/09/15 1116  BP: 136/56 91/60  Pulse: 81 76  Temp: 36.4 C 36.2 C  Resp: 16 12    Complications: No apparent anesthesia complications

## 2015-11-09 NOTE — H&P (Signed)
Primary Care Physician:  Loistine Chance, MD  Pre-Procedure History & Physical: HPI:  Jennifer Castro is a 60 y.o. female is here for an colonoscopy.   Past Medical History  Diagnosis Date  . Hyperlipidemia   . Hypertension   . Gastroesophageal reflux disease   . Asthma   . Diabetes mellitus type II   . Carpal tunnel syndrome   . Vitiligo   . Hemorrhoids   . Tendonitis, Achilles   . Disorder of rotator cuff   . OSA (obstructive sleep apnea)   . Excess or deficiency of vitamin D   . Ovarian failure   . Obesity     Past Surgical History  Procedure Laterality Date  . Supracervical abdominal hysterectomy      without BSO fibroid tumors  . Cardiac catheterization  [place]    Prior to Admission medications   Medication Sig Start Date End Date Taking? Authorizing Provider  albuterol (PROVENTIL HFA;VENTOLIN HFA) 108 (90 BASE) MCG/ACT inhaler Inhale 2 puffs into the lungs every 6 (six) hours as needed for wheezing or shortness of breath. 08/15/15  Yes Steele Sizer, MD  amLODipine-atorvastatin (CADUET) 10-40 MG per tablet Take 1 tablet by mouth daily.   Yes Historical Provider, MD  aspirin 81 MG tablet Take 81 mg by mouth daily.   Yes Historical Provider, MD  benzonatate (TESSALON) 100 MG capsule Take 1 capsule (100 mg total) by mouth 3 (three) times daily as needed for cough. 08/15/15  Yes Steele Sizer, MD  calcium carbonate-magnesium hydroxide (ROLAIDS) 334 MG CHEW Chew 1 tablet by mouth as needed.   Yes Historical Provider, MD  diclofenac sodium (VOLTAREN) 1 % GEL Apply topically as needed.    Yes Historical Provider, MD  fluticasone (FLONASE) 50 MCG/ACT nasal spray Place 2 sprays into both nostrils daily. 08/15/15  Yes Steele Sizer, MD  Fluticasone-Salmeterol (ADVAIR) 250-50 MCG/DOSE AEPB Inhale 1 puff into the lungs as needed.    Yes Historical Provider, MD  metFORMIN (GLUCOPHAGE) 850 MG tablet Take 850 mg by mouth 2 (two) times daily.   Yes Historical  Provider, MD  montelukast (SINGULAIR) 10 MG tablet Take 1 tablet (10 mg total) by mouth at bedtime. 08/15/15  Yes Steele Sizer, MD  POLYETHYLENE GLYCOL 3350 PO Take by mouth as needed. Mix with 8oz fluid.   Yes Historical Provider, MD  predniSONE (DELTASONE) 20 MG tablet Take 1 tablet (20 mg total) by mouth 2 (two) times daily with a meal. 08/15/15  Yes Steele Sizer, MD  valsartan-hydrochlorothiazide (DIOVAN-HCT) 320-25 MG per tablet Take 1 tablet by mouth daily.   Yes Historical Provider, MD  VITAMIN D, CHOLECALCIFEROL, PO Take by mouth daily.   Yes Historical Provider, MD  Vitamin D, Ergocalciferol, (DRISDOL) 50000 UNITS CAPS capsule Take 1 capsule (50,000 Units total) by mouth every 7 (seven) days. 08/16/15  Yes Steele Sizer, MD    Allergies as of 11/02/2015 - Review Complete 08/22/2015  Allergen Reaction Noted  . Ace inhibitors  01/06/2012    Family History  Problem Relation Age of Onset  . Heart attack Mother     Social History   Social History  . Marital Status: Married    Spouse Name: N/A  . Number of Children: N/A  . Years of Education: N/A   Occupational History  . Not on file.   Social History Main Topics  . Smoking status: Former Smoker -- 0.50 packs/day for 10 years    Types: Cigarettes    Start  date: 09/15/1969    Quit date: 05/21/1980  . Smokeless tobacco: Never Used  . Alcohol Use: No  . Drug Use: No  . Sexual Activity: Not Currently   Other Topics Concern  . Not on file   Social History Narrative     Physical Exam: BP 136/56 mmHg  Pulse 81  Temp(Src) 97.6 F (36.4 C) (Tympanic)  Resp 16  Ht 5' 3.5" (1.613 m)  Wt 127.461 kg (281 lb)  BMI 48.99 kg/m2  SpO2 100% General:   Alert,  pleasant and cooperative in NAD Head:  Normocephalic and atraumatic. Neck:  Supple; no masses or thyromegaly. Lungs:  Clear throughout to auscultation.    Heart:  Regular rate and rhythm. Abdomen:  Soft, nontender and nondistended. Normal bowel sounds, without  guarding, and without rebound.   Neurologic:  Alert and  oriented x4;  grossly normal neurologically.  Impression/Plan: Jennifer Castro is here for an colonoscopy to be performed for surveillance, hx polyyps  Risks, benefits, limitations, and alternatives regarding  colonoscopy have been reviewed with the patient.  Questions have been answered.  All parties agreeable.   Josefine Class, MD  11/09/2015, 10:39 AM

## 2015-11-09 NOTE — Discharge Instructions (Signed)

## 2015-11-09 NOTE — Op Note (Signed)
[facility] Gastroenterology Patient Name: Jennifer Castro Procedure Date: 11/09/2015 10:40 AM MRN: [id number] Account #: [id number] Date of Birth: Jan 14, 1956 Admit Type: Outpatient Age: 60 Room: [facility] ENDO ROOM 2 Gender: Female Note Status: Finalized Procedure:            Colonoscopy Indications:          High risk colon cancer surveillance: Personal history                        of colonic polyps, Last colonoscopy 5 years ago Patient Profile:      This is a 60 year old female. Providers:            Gerrit Heck. Rayann Heman, MD Referring MD:         Bethena Roys. Sowles, MD (Referring MD) Medicines:            Propofol per Anesthesia Complications:        No immediate complications. Procedure:            Pre-Anesthesia Assessment:                       - Prior to the procedure, a History and Physical was                        performed, and patient medications, allergies and                        sensitivities were reviewed. The patient's tolerance of                        previous anesthesia was reviewed.                       After obtaining informed consent, the colonoscope was                        passed under direct vision. Throughout the procedure,                        the patient's blood pressure, pulse, and oxygen                        saturations were monitored continuously. The Olympus                        CF-H180AL colonoscope ( S#: [id number] ) was introduced                        through the anus and advanced to the the cecum,                        identified by appendiceal orifice and ileocecal valve.                        The colonoscopy was performed without difficulty. The                        patient tolerated the procedure well. The quality of                        the bowel  preparation was good. Findings:      The perianal and digital rectal examinations were normal.      A 3 mm polyp was found in the transverse colon. The polyp was sessile.      The polyp was removed with a jumbo cold forceps. Resection and retrieval       were complete.      A 4 mm polyp was found in the descending colon. The polyp was sessile.       The polyp was removed with a jumbo cold forceps. Resection and retrieval       were complete.      A few small-mouthed diverticula were found in the sigmoid colon.      The exam was otherwise without abnormality on direct and retroflexion       views. Impression:           - One 3 mm polyp in the transverse colon, removed with                        a jumbo cold forceps. Resected and retrieved.                       - One 4 mm polyp in the descending colon, removed with                        a jumbo cold forceps. Resected and retrieved.                       - Diverticulosis in the sigmoid colon.                       - The examination was otherwise normal on direct and                        retroflexion views. Recommendation:       - Observe patient in GI recovery unit.                       - High fiber diet.                       - Continue present medications.                       - Await pathology results.                       - Repeat colonoscopy for surveillance based on                        pathology results.                       - Return to referring physician.                       - The findings and recommendations were discussed with                        the patient.                       - The findings and recommendations were discussed with  the patient's family. Procedure Code(s):    --- Professional ---                       [phone], Colonoscopy, flexible; with biopsy, single or                        multiple Diagnosis Code(s):    --- Professional ---                       Z12.11, Encounter for screening for malignant neoplasm                        of colon                       D12.3, Benign neoplasm of transverse colon (hepatic                        flexure  or splenic flexure)                       D12.4, Benign neoplasm of descending colon                       K57.30, Diverticulosis of large intestine without                        perforation or abscess without bleeding CPT copyright 2016 American Medical Association. All rights reserved. The codes documented in this report are preliminary and upon coder review may  be revised to meet current compliance requirements. Mellody Life, MD 11/09/2015 11:16:30 AM This report has been signed electronically. Number of Addenda: 0 Note Initiated On: 11/09/2015 10:40 AM Scope Withdrawal Time: 0 hours 9 minutes 15 seconds  Total Procedure Duration: 0 hours 23 minutes 20 seconds       [facility]

## 2015-11-09 NOTE — Anesthesia Preprocedure Evaluation (Signed)
Anesthesia Evaluation  Patient identified by MRN, date of birth, ID band Patient awake    Reviewed: Allergy & Precautions, H&P , NPO status , Patient's Chart, lab work & pertinent test results  History of Anesthesia Complications Negative for: history of anesthetic complications  Airway Mallampati: III  TM Distance: >3 FB Neck ROM: full    Dental  (+) Poor Dentition, Upper Dentures, Lower Dentures, Missing   Pulmonary neg shortness of breath, asthma , sleep apnea , former smoker,    Pulmonary exam normal breath sounds clear to auscultation       Cardiovascular Exercise Tolerance: Good hypertension, (-) angina(-) Past MI and (-) DOE Normal cardiovascular exam Rhythm:regular Rate:Normal     Neuro/Psych  Neuromuscular disease negative psych ROS   GI/Hepatic Neg liver ROS, GERD  Controlled,  Endo/Other  diabetes, Type 2  Renal/GU Renal disease  negative genitourinary   Musculoskeletal negative musculoskeletal ROS (+)   Abdominal   Peds negative pediatric ROS (+)  Hematology negative hematology ROS (+)   Anesthesia Other Findings Past Medical History:   Hyperlipidemia                                               Hypertension                                                 Gastroesophageal reflux disease                              Asthma                                                       Diabetes mellitus type II                                    Carpal tunnel syndrome                                       Vitiligo                                                     Hemorrhoids                                                  Tendonitis, Achilles                                         Disorder of rotator cuff  OSA (obstructive sleep apnea)                                Excess or deficiency of vitamin D                            Ovarian failure                                               Obesity                                                     Past Surgical History:   SUPRACERVICAL ABDOMINAL HYSTERECTOMY                            Comment:without BSO fibroid tumors   CARDIAC CATHETERIZATION                          1977           Comment:[facility]  BMI    Body Mass Index   48.99 kg/m 2      Reproductive/Obstetrics negative OB ROS                             Anesthesia Physical Anesthesia Plan  ASA: III  Anesthesia Plan: General   Post-op Pain Management:    Induction:   Airway Management Planned:   Additional Equipment:   Intra-op Plan:   Post-operative Plan:   Informed Consent: I have reviewed the patients History and Physical, chart, labs and discussed the procedure including the risks, benefits and alternatives for the proposed anesthesia with the patient or authorized representative who has indicated his/her understanding and acceptance.   Dental Advisory Given  Plan Discussed with: Anesthesiologist, CRNA and Surgeon  Anesthesia Plan Comments:         Anesthesia Quick Evaluation

## 2015-11-09 NOTE — Anesthesia Postprocedure Evaluation (Signed)
Anesthesia Post Note  Patient: Jennifer Castro  Procedure(s) Performed: Procedure(s) (LRB): COLONOSCOPY WITH PROPOFOL (N/A)  Patient location during evaluation: Endoscopy Anesthesia Type: General Level of consciousness: awake and alert Pain management: pain level controlled Vital Signs Assessment: post-procedure vital signs reviewed and stable Respiratory status: spontaneous breathing, nonlabored ventilation, respiratory function stable and patient connected to nasal cannula oxygen Cardiovascular status: blood pressure returned to baseline and stable Postop Assessment: no signs of nausea or vomiting Anesthetic complications: no    Last Vitals:  Filed Vitals:   11/09/15 1130 11/09/15 1140  BP: 121/69 132/85  Pulse: 75 71  Temp:    Resp: 22 26    Last Pain: There were no vitals filed for this visit.               Precious Haws [place]it[place]

## 2015-11-12 LAB — SURGICAL PATHOLOGY

## 2015-11-14 ENCOUNTER — Encounter: Payer: Self-pay | Admitting: Gastroenterology

## 2015-11-20 ENCOUNTER — Ambulatory Visit: Payer: BLUE CROSS/BLUE SHIELD | Admitting: Family Medicine

## 2015-11-22 ENCOUNTER — Ambulatory Visit (INDEPENDENT_AMBULATORY_CARE_PROVIDER_SITE_OTHER): Payer: BLUE CROSS/BLUE SHIELD | Admitting: Family Medicine

## 2015-11-22 ENCOUNTER — Encounter: Payer: Self-pay | Admitting: Family Medicine

## 2015-11-22 VITALS — BP 130/58 | HR 102 | Temp 98.0°F | Resp 16 | Ht 64.0 in | Wt 282.0 lb

## 2015-11-22 DIAGNOSIS — G4733 Obstructive sleep apnea (adult) (pediatric): Secondary | ICD-10-CM

## 2015-11-22 DIAGNOSIS — E785 Hyperlipidemia, unspecified: Secondary | ICD-10-CM | POA: Diagnosis not present

## 2015-11-22 DIAGNOSIS — E1129 Type 2 diabetes mellitus with other diabetic kidney complication: Secondary | ICD-10-CM | POA: Diagnosis not present

## 2015-11-22 DIAGNOSIS — R809 Proteinuria, unspecified: Secondary | ICD-10-CM

## 2015-11-22 DIAGNOSIS — J4520 Mild intermittent asthma, uncomplicated: Secondary | ICD-10-CM

## 2015-11-22 DIAGNOSIS — I10 Essential (primary) hypertension: Secondary | ICD-10-CM

## 2015-11-22 DIAGNOSIS — M79671 Pain in right foot: Secondary | ICD-10-CM | POA: Diagnosis not present

## 2015-11-22 DIAGNOSIS — E1140 Type 2 diabetes mellitus with diabetic neuropathy, unspecified: Secondary | ICD-10-CM

## 2015-11-22 LAB — POCT GLYCOSYLATED HEMOGLOBIN (HGB A1C): Hemoglobin A1C: 7.1

## 2015-11-22 MED ORDER — METFORMIN HCL 850 MG PO TABS: 850.0000 mg | ORAL_TABLET | Freq: Every evening | ORAL | Status: DC

## 2015-11-22 MED ORDER — VALSARTAN-HYDROCHLOROTHIAZIDE 320-25 MG PO TABS: 1.0000 | ORAL_TABLET | Freq: Every day | ORAL | Status: DC

## 2015-11-22 MED ORDER — AMLODIPINE-ATORVASTATIN 10-40 MG PO TABS: 1.0000 | ORAL_TABLET | Freq: Every day | ORAL | Status: DC

## 2015-11-22 NOTE — Progress Notes (Signed)
Name: Jennifer Castro   MRN: [id number]    DOB: [birth year]   Date:11/22/2015       Progress Note  Subjective  Chief Complaint  Chief Complaint  Patient presents with  . Diabetes    patient is here for her 39-month f/u  . Hypertension  . Sleep Apnea  . Asthma  . Obesity  . Hyperlipidemia    HPI  HTN: she has been back on Caduet and Valsartan/HCTZ daily. BP is at goal.  No chest pain or palpitation , she has not been checking bp at home but denies orthostatic hypotension symptoms.   DM: she has neuropathy and also proteinuria. On ARB, paresthesia on her right foot has improved. She is on Metformin daily -less side effects as long as she takes at night. No polyphagia, polydipsia or polyuria. HgbA1C has gone up to 7.1%  Morbid Obesity: she has no lost any weight since last visit. She has not been very compliant with her diet lately.   Asthma Mild intermittent: she has been doing well, no SOB, wheezing or cough lately, not using Advair or rescue inhaler.   Sleep Apnea: she will go for another sleep study after she pays for her colonoscopy   Hyperlipidemia: taking Caduet and denies side effects of medication    Patient Active Problem List   Diagnosis Date Noted  . Microalbuminuria due to type 2 diabetes mellitus ([place]) 08/22/2015  . Perennial allergic rhinitis 08/15/2015  . Hypertension, benign 05/22/2015  . Hyperlipidemia 05/22/2015  . GERD (gastroesophageal reflux disease) 05/22/2015  . Diabetes mellitus with neuropathy ([place]) 05/22/2015  . Mild intermittent asthma 05/22/2015  . OSA (obstructive sleep apnea) 05/22/2015  . Obesity, Class III, BMI 40-49.9 (morbid obesity) ([place]) 05/22/2015  . Vitamin D deficiency 05/22/2015  . Chronic constipation 05/22/2015  . Vitiligo 05/22/2015    Past Surgical History  Procedure Laterality Date  . Supracervical abdominal hysterectomy      without BSO fibroid tumors  . Cardiac catheterization  [place]  . Colonoscopy with  propofol N/A 11/09/2015    Procedure: COLONOSCOPY WITH PROPOFOL;  Surgeon: Josefine Class, MD;  Location: [facility] ENDOSCOPY;  Service: Endoscopy;  Laterality: N/A;    Family History  Problem Relation Age of Onset  . Heart attack Mother     Social History   Social History  . Marital Status: Married    Spouse Name: N/A  . Number of Children: N/A  . Years of Education: N/A   Occupational History  . Not on file.   Social History Main Topics  . Smoking status: Former Smoker -- 0.50 packs/day for 10 years    Types: Cigarettes    Start date: 09/15/1969    Quit date: 05/21/1980  . Smokeless tobacco: Never Used  . Alcohol Use: No  . Drug Use: No  . Sexual Activity: Not Currently   Other Topics Concern  . Not on file   Social History Narrative     Current outpatient prescriptions:  .  albuterol (PROVENTIL HFA;VENTOLIN HFA) 108 (90 BASE) MCG/ACT inhaler, Inhale 2 puffs into the lungs every 6 (six) hours as needed for wheezing or shortness of breath., Disp: 1 Inhaler, Rfl: 0 .  amLODipine-atorvastatin (CADUET) 10-40 MG tablet, Take 1 tablet by mouth daily., Disp: 90 tablet, Rfl: 1 .  aspirin 81 MG tablet, Take 81 mg by mouth daily., Disp: , Rfl:  .  Ca Carbonate-Mag Hydroxide 550-110 MG CHEW, Chew by mouth., Disp: , Rfl:  .  diclofenac sodium (VOLTAREN) 1 % GEL, Apply topically as needed. , Disp: , Rfl:  .  fluticasone (FLONASE) 50 MCG/ACT nasal spray, Place 2 sprays into both nostrils daily., Disp: 16 g, Rfl: 6 .  metFORMIN (GLUCOPHAGE) 850 MG tablet, Take 1 tablet (850 mg total) by mouth every evening., Disp: 90 tablet, Rfl: 1 .  POLYETHYLENE GLYCOL 3350 PO, Take by mouth as needed. Mix with 8oz fluid., Disp: , Rfl:  .  valsartan-hydrochlorothiazide (DIOVAN-HCT) 320-25 MG tablet, Take 1 tablet by mouth daily., Disp: 90 tablet, Rfl: 1 .  VITAMIN D, CHOLECALCIFEROL, PO, Take by mouth daily., Disp: , Rfl:   Allergies  Allergen Reactions  . Ace Inhibitors       ROS  Constitutional: Negative for fever or weight change.  Respiratory: Negative for cough and shortness of breath.   Cardiovascular: Negative for chest pain or palpitations.  Gastrointestinal: Negative for abdominal pain, no bowel changes.  Musculoskeletal: Negative for gait problem ( occasionally secondary to heel spurs right foot ) or joint swelling.  Skin: Negative for rash.  Neurological: Negative for dizziness or headache.  No other specific complaints in a complete review of systems (except as listed in HPI above).  Objective  Filed Vitals:   11/22/15 1449  BP: 130/58  Pulse: 102  Temp: 98 F (36.7 C)  TempSrc: Oral  Resp: 16  Height: 5\' 4"  (1.626 m)  Weight: 282 lb (127.914 kg)  SpO2: 98%    Body mass index is 48.38 kg/(m^2).  Physical Exam  Constitutional: Patient appears well-developed and well-nourished. Obese  No distress.  HEENT: head atraumatic, normocephalic, pupils equal and reactive to light, neck supple, throat within normal limits Cardiovascular: Normal rate, regular rhythm and normal heart sounds.  No murmur heard. No BLE edema. Pulmonary/Chest: Effort normal and breath sounds normal. No respiratory distress. Abdominal: Soft.  There is no tenderness. Psychiatric: Patient has a normal mood and affect. behavior is normal. Judgment and thought content normal. Muscular Skeletal: pain during palpation of right achilles tendon - she has a history of heel spur and would like to see POdiatrist  Recent Results (from the past 2160 hour(s))  Glucose, capillary     Status: None   Collection Time: 11/09/15 10:27 AM  Result Value Ref Range   Glucose-Capillary 96 65 - 99 mg/dL   Comment 1 IN EPIC   Surgical pathology     Status: None   Collection Time: 11/09/15 11:04 AM  Result Value Ref Range   SURGICAL PATHOLOGY      Surgical Pathology CASE: [id number] PATIENT: Jennifer Castro Surgical Pathology Report     SPECIMEN SUBMITTED: A. Colon polyp,  transverse, cbx B. Colon polyp, descending, cbx  CLINICAL HISTORY: None provided  PRE-OPERATIVE DIAGNOSIS: P HX polyps  POST-OPERATIVE DIAGNOSIS: Polyps, diverticulosis     DIAGNOSIS: A. COLON POLYP, TRANSVERSE; COLD BIOPSY: - TUBULAR ADENOMA. - NEGATIVE FOR HIGH-GRADE DYSPLASIA AND MALIGNANCY.  B. COLON POLYP, DESCENDING; COLD BIOPSY: - TUBULAR ADENOMA. - NEGATIVE FOR HIGH-GRADE DYSPLASIA AND MALIGNANCY.   GROSS DESCRIPTION:  A. Labeled: C BX polyp transverse colon  Tissue fragment(s): 2  Size: 0.2 and 0.3 cm  Description: Tan  Entirely submitted in one cassette(s).   B. Labeled: C BX polyp descending colon  Tissue fragment(s): 3  Size: 0.2 cm  Description: Tan  Entirely submitted in one cassette(s).    Final Diagnosis performed by Delorse Lek, MD.  Electronically signed 11/12/2015 4:12:18PM    The e lectronic signature indicates that the named Attending Pathologist  has evaluated the specimen  Technical component performed at [facility], [address] [place], [place] [address] Lab: [phone] Dir: Darrick Penna. Evette Doffing, MD  Professional component performed at [facility], [facility], [place], [place], [place] [address] Lab: [phone] Dir: Dellia Nims. Rubinas, MD    POCT glycosylated hemoglobin (Hb A1C)     Status: Abnormal   Collection Time: 11/22/15  3:44 PM  Result Value Ref Range   Hemoglobin A1C 7.1      PHQ2/9: Depression screen [facility] 2/9 11/22/2015 08/15/2015 05/22/2015  Decreased Interest 0 0 0  Down, Depressed, Hopeless 0 0 0  PHQ - 2 Score 0 0 0     Fall Risk: Fall Risk  11/22/2015 08/15/2015 05/22/2015  Falls in the past year? No No No     Functional Status Survey: Is the patient deaf or have difficulty hearing?: No Does the patient have difficulty seeing, even when wearing glasses/contacts?: No Does the patient have difficulty concentrating, remembering, or making decisions?: No Does the patient have  difficulty walking or climbing stairs?: No Does the patient have difficulty dressing or bathing?: No Does the patient have difficulty doing errands alone such as visiting a doctor's office or shopping?: No    Assessment & Plan  1. Type 2 diabetes mellitus with diabetic neuropathy, without long-term current use of insulin (HCC)  - POCT glycosylated hemoglobin (Hb A1C) has gone up, discussed life style modification and if no improvement we will adjust medication on her next visit - metFORMIN (GLUCOPHAGE) 850 MG tablet; Take 1 tablet (850 mg total) by mouth every evening.  Dispense: 90 tablet; Refill: 1  2. Mild intermittent asthma, uncomplicated  - PR EVAL OF BRONCHOSPASM  3. Hypertension, benign  - amLODipine-atorvastatin (CADUET) 10-40 MG tablet; Take 1 tablet by mouth daily.  Dispense: 90 tablet; Refill: 1 - valsartan-hydrochlorothiazide (DIOVAN-HCT) 320-25 MG tablet; Take 1 tablet by mouth daily.  Dispense: 90 tablet; Refill: 1  4. Microalbuminuria due to type 2 diabetes mellitus (HCC)  - metFORMIN (GLUCOPHAGE) 850 MG tablet; Take 1 tablet (850 mg total) by mouth every evening.  Dispense: 90 tablet; Refill: 1  5. OSA (obstructive sleep apnea)  She will let me know when ready to have sleep study  6. Hyperlipidemia  - valsartan-hydrochlorothiazide (DIOVAN-HCT) 320-25 MG tablet; Take 1 tablet by mouth daily.  Dispense: 90 tablet; Refill: 1  7. Heel pain, right  - Ambulatory referral to Podiatry

## 2015-12-07 ENCOUNTER — Encounter: Payer: Self-pay | Admitting: Family Medicine

## 2015-12-26 ENCOUNTER — Other Ambulatory Visit: Payer: Self-pay

## 2015-12-26 MED ORDER — VITAMIN D (ERGOCALCIFEROL) 1.25 MG (50000 UNIT) PO CAPS: 50000.0000 [IU] | ORAL_CAPSULE | ORAL | Status: DC

## 2015-12-26 NOTE — Telephone Encounter (Signed)
Patient requesting refill. 

## 2016-01-10 ENCOUNTER — Ambulatory Visit: Payer: BLUE CROSS/BLUE SHIELD | Admitting: Podiatry

## 2016-01-18 ENCOUNTER — Encounter: Payer: Self-pay | Admitting: Family Medicine

## 2016-01-18 ENCOUNTER — Ambulatory Visit (INDEPENDENT_AMBULATORY_CARE_PROVIDER_SITE_OTHER): Payer: BLUE CROSS/BLUE SHIELD | Admitting: Family Medicine

## 2016-01-18 VITALS — BP 126/68 | HR 97 | Temp 98.0°F | Resp 16 | Wt 282.3 lb

## 2016-01-18 DIAGNOSIS — E559 Vitamin D deficiency, unspecified: Secondary | ICD-10-CM

## 2016-01-18 DIAGNOSIS — I10 Essential (primary) hypertension: Secondary | ICD-10-CM | POA: Diagnosis not present

## 2016-01-18 DIAGNOSIS — Z01419 Encounter for gynecological examination (general) (routine) without abnormal findings: Secondary | ICD-10-CM

## 2016-01-18 DIAGNOSIS — K626 Ulcer of anus and rectum: Secondary | ICD-10-CM | POA: Diagnosis not present

## 2016-01-18 DIAGNOSIS — Z23 Encounter for immunization: Secondary | ICD-10-CM

## 2016-01-18 DIAGNOSIS — Z79899 Other long term (current) drug therapy: Secondary | ICD-10-CM | POA: Diagnosis not present

## 2016-01-18 DIAGNOSIS — Z1239 Encounter for other screening for malignant neoplasm of breast: Secondary | ICD-10-CM

## 2016-01-18 DIAGNOSIS — E785 Hyperlipidemia, unspecified: Secondary | ICD-10-CM

## 2016-01-18 DIAGNOSIS — Z0000 Encounter for general adult medical examination without abnormal findings: Secondary | ICD-10-CM | POA: Diagnosis not present

## 2016-01-18 MED ORDER — HYDROCORTISONE ACETATE 25 MG RE SUPP: 25.0000 mg | Freq: Two times a day (BID) | RECTAL | Status: DC

## 2016-01-18 NOTE — Progress Notes (Signed)
Name: Jennifer Castro   MRN: [id number]    DOB: 06-Nov-1955   Date:01/18/2016       Progress Note  Subjective  Chief Complaint  Chief Complaint  Patient presents with  . Annual Exam  . rectum pain    patient stated that since she had her colonoscopy she has been having some right sided rectum discomfort.    HPI  Well woman: she is not sexually active, she denies vaginal discharge, no bladder complaints. She states that 2 weeks after  colonoscopy she has felt an aching pain on anal area, constant pain, improves with bowel movement, worse when sitting on it, no pain or blood when she wipes her bottom. No change in bowel movements   Patient Active Problem List   Diagnosis Date Noted  . Microalbuminuria due to type 2 diabetes mellitus ([place]) 08/22/2015  . Perennial allergic rhinitis 08/15/2015  . Hypertension, benign 05/22/2015  . Hyperlipidemia 05/22/2015  . GERD (gastroesophageal reflux disease) 05/22/2015  . Diabetes mellitus with neuropathy ([place]) 05/22/2015  . Mild intermittent asthma 05/22/2015  . OSA (obstructive sleep apnea) 05/22/2015  . Obesity, Class III, BMI 40-49.9 (morbid obesity) ([place]) 05/22/2015  . Vitamin D deficiency 05/22/2015  . Chronic constipation 05/22/2015  . Vitiligo 05/22/2015    Past Surgical History  Procedure Laterality Date  . Supracervical abdominal hysterectomy      without BSO fibroid tumors  . Cardiac catheterization  [place]  . Colonoscopy with propofol N/A 11/09/2015    Procedure: COLONOSCOPY WITH PROPOFOL;  Surgeon: Josefine Class, MD;  Location: [facility] ENDOSCOPY;  Service: Endoscopy;  Laterality: N/A;    Family History  Problem Relation Age of Onset  . Heart attack Mother     Social History   Social History  . Marital Status: Married    Spouse Name: N/A  . Number of Children: N/A  . Years of Education: N/A   Occupational History  . Not on file.   Social History Main Topics  . Smoking status: Former Smoker -- 0.50  packs/day for 10 years    Types: Cigarettes    Start date: 09/15/1969    Quit date: 05/21/1980  . Smokeless tobacco: Never Used  . Alcohol Use: No  . Drug Use: No  . Sexual Activity: Not Currently   Other Topics Concern  . Not on file   Social History Narrative     Current outpatient prescriptions:  .  amLODipine-atorvastatin (CADUET) 10-40 MG tablet, Take 1 tablet by mouth daily., Disp: 90 tablet, Rfl: 1 .  aspirin 81 MG tablet, Take 81 mg by mouth daily., Disp: , Rfl:  .  Ca Carbonate-Mag Hydroxide 550-110 MG CHEW, Chew by mouth., Disp: , Rfl:  .  diclofenac sodium (VOLTAREN) 1 % GEL, Apply topically as needed. , Disp: , Rfl:  .  metFORMIN (GLUCOPHAGE) 850 MG tablet, Take 1 tablet (850 mg total) by mouth every evening., Disp: 90 tablet, Rfl: 1 .  POLYETHYLENE GLYCOL 3350 PO, Take by mouth as needed. Mix with 8oz fluid., Disp: , Rfl:  .  valsartan-hydrochlorothiazide (DIOVAN-HCT) 320-25 MG tablet, Take 1 tablet by mouth daily., Disp: 90 tablet, Rfl: 1 .  Vitamin D, Ergocalciferol, (DRISDOL) 50000 units CAPS capsule, Take 1 capsule (50,000 Units total) by mouth every 7 (seven) days., Disp: 12 capsule, Rfl: 0 .  albuterol (PROVENTIL HFA;VENTOLIN HFA) 108 (90 BASE) MCG/ACT inhaler, Inhale 2 puffs into the lungs every 6 (six) hours as needed for wheezing or shortness of breath. (  Patient not taking: Reported on 01/18/2016), Disp: 1 Inhaler, Rfl: 0 .  fluticasone (FLONASE) 50 MCG/ACT nasal spray, Place 2 sprays into both nostrils daily. (Patient not taking: Reported on 01/18/2016), Disp: 16 g, Rfl: 6  Allergies  Allergen Reactions  . Ace Inhibitors      ROS  Constitutional: Negative for fever or weight change.  Respiratory: Negative for cough and shortness of breath.   Cardiovascular: Negative for chest pain or palpitations.  Gastrointestinal: Negative for abdominal pain, no bowel changes.  Musculoskeletal: Negative for gait problem or joint swelling.  Skin: Negative for rash.   Neurological: Negative for dizziness or headache.  No other specific complaints in a complete review of systems (except as listed in HPI above).   Objective  Filed Vitals:   01/18/16 0834  BP: 126/68  Pulse: 97  Temp: 98 F (36.7 C)  TempSrc: Oral  Resp: 16  Weight: 282 lb 4.8 oz (128.05 kg)  SpO2: 97%    Body mass index is 48.43 kg/(m^2).  Physical Exam  Constitutional: Patient appears well-developed and obese . No distress.  HENT: Head: Normocephalic and atraumatic. Ears: B TMs ok, no erythema or effusion; Nose: Nose normal. Mouth/Throat: Oropharynx is clear and moist. No oropharyngeal exudate.  Eyes: Conjunctivae and EOM are normal. Pupils are equal, round, and reactive to light. No scleral icterus.  Neck: Normal range of motion. Neck supple. No JVD present. No thyromegaly present.  Cardiovascular: Normal rate, regular rhythm and normal heart sounds.  No murmur heard. No BLE edema. Pulmonary/Chest: Effort normal and breath sounds normal. No respiratory distress. Abdominal: Soft. Bowel sounds are normal, no distension. There is no tenderness. no masses Breast: no lumps or masses, no nipple discharge or rashes FEMALE GENITALIA:  Not done RECTAL: no rectal masses or hemorrhoids, she has a small healing ulceration at 2 o'clock position, since she just had a normal colonoscopy we will treat with Anusol and advised to call back if no improvement Musculoskeletal: Normal range of motion, no joint effusions. No gross deformities Neurological: he is alert and oriented to person, place, and time. No cranial nerve deficit. Coordination, balance, strength, speech and gait are normal.  Skin: Skin is warm and dry. No rash noted. No erythema.  Psychiatric: Patient has a normal mood and affect. behavior is normal. Judgment and thought content normal.  Recent Results (from the past 2160 hour(s))  Glucose, capillary     Status: None   Collection Time: 11/09/15 10:27 AM  Result Value Ref  Range   Glucose-Capillary 96 65 - 99 mg/dL   Comment 1 IN EPIC   Surgical pathology     Status: None   Collection Time: 11/09/15 11:04 AM  Result Value Ref Range   SURGICAL PATHOLOGY      Surgical Pathology CASE: [id number] PATIENT: Edwyna Peri Surgical Pathology Report     SPECIMEN SUBMITTED: A. Colon polyp, transverse, cbx B. Colon polyp, descending, cbx  CLINICAL HISTORY: None provided  PRE-OPERATIVE DIAGNOSIS: P HX polyps  POST-OPERATIVE DIAGNOSIS: Polyps, diverticulosis     DIAGNOSIS: A. COLON POLYP, TRANSVERSE; COLD BIOPSY: - TUBULAR ADENOMA. - NEGATIVE FOR HIGH-GRADE DYSPLASIA AND MALIGNANCY.  B. COLON POLYP, DESCENDING; COLD BIOPSY: - TUBULAR ADENOMA. - NEGATIVE FOR HIGH-GRADE DYSPLASIA AND MALIGNANCY.   GROSS DESCRIPTION:  A. Labeled: C BX polyp transverse colon  Tissue fragment(s): 2  Size: 0.2 and 0.3 cm  Description: Tan  Entirely submitted in one cassette(s).   B. Labeled: C BX polyp descending colon  Tissue fragment(s): 3  Size: 0.2 cm  Description: Tan  Entirely submitted in one cassette(s).    Final Diagnosis performed by Delorse Lek, MD.  Electronically signed 11/12/2015 4:12:18PM    The e lectronic signature indicates that the named Attending Pathologist has evaluated the specimen  Technical component performed at [facility], [address] [place], [place] [address] Lab: [phone] Dir: Darrick Penna. Evette Doffing, MD  Professional component performed at [facility], [facility], [place], [place], [place] [address] Lab: [phone] Dir: Dellia Nims. Reuel Derby, MD    HM COLONOSCOPY     Status: None   Collection Time: 11/09/15 11:04 AM  Result Value Ref Range   HM Colonoscopy colon polyp, tranverse. colon polyp, descending.     Comment: performed by Dr. Arther Dames  POCT glycosylated hemoglobin (Hb A1C)     Status: Abnormal   Collection Time: 11/22/15  3:44 PM  Result Value Ref Range   Hemoglobin A1C  7.1      PHQ2/9: Depression screen [facility] 2/9 01/18/2016 11/22/2015 08/15/2015 05/22/2015  Decreased Interest 0 0 0 0  Down, Depressed, Hopeless 0 0 0 0  PHQ - 2 Score 0 0 0 0     Fall Risk: Fall Risk  01/18/2016 11/22/2015 08/15/2015 05/22/2015  Falls in the past year? No No No No     Functional Status Survey: Is the patient deaf or have difficulty hearing?: No Does the patient have difficulty seeing, even when wearing glasses/contacts?: No Does the patient have difficulty concentrating, remembering, or making decisions?: No Does the patient have difficulty walking or climbing stairs?: No Does the patient have difficulty dressing or bathing?: No Does the patient have difficulty doing errands alone such as visiting a doctor's office or shopping?: No    Assessment & Plan  1. Well woman exam  Discussed importance of 150 minutes of physical activity weekly, eat two servings of fish weekly, eat one serving of tree nuts ( cashews, pistachios, pecans, almonds.Marland Kitchen) every other day, eat 6 servings of fruit/vegetables daily and drink plenty of water and avoid sweet beverages.   2. Breast cancer screening  -mammogram   3. Hyperlipidemia  - Lipid panel  4. Vitamin D deficiency  - VITAMIN D 25 Hydroxy (Vit-D Deficiency, Fractures)  5. Hypertension, benign  - Comprehensive metabolic panel  6. Long-term use of high-risk medication  - CBC with Differential/Platelet  7. Need for shingles vaccine  - Varicella-zoster vaccine subcutaneous   8. Anal ulceration  - hydrocortisone (ANUSOL-HC) 25 MG suppository; Place 1 suppository (25 mg total) rectally 2 (two) times daily.  Dispense: 14 suppository; Refill: 0

## 2016-01-19 LAB — COMPREHENSIVE METABOLIC PANEL
ALT: 15 IU/L (ref 0–32)
AST: 16 IU/L (ref 0–40)
Albumin/Globulin Ratio: 1.3 (ref 1.2–2.2)
Albumin: 4.1 g/dL (ref 3.6–4.8)
Alkaline Phosphatase: 99 IU/L (ref 39–117)
BUN/Creatinine Ratio: 17 (ref 12–28)
BUN: 12 mg/dL (ref 8–27)
Bilirubin Total: 0.3 mg/dL (ref 0.0–1.2)
CO2: 22 mmol/L (ref 18–29)
CREATININE: 0.71 mg/dL (ref 0.57–1.00)
Calcium: 9.4 mg/dL (ref 8.7–10.3)
Chloride: 96 mmol/L (ref 96–106)
GFR calc Af Amer: 107 mL/min/{1.73_m2} (ref >59)
GFR, EST NON AFRICAN AMERICAN: 93 mL/min/{1.73_m2} (ref >59)
GLOBULIN, TOTAL: 3.2 g/dL (ref 1.5–4.5)
GLUCOSE: 125 mg/dL — AB (ref 65–99)
Potassium: 3.8 mmol/L (ref 3.5–5.2)
Sodium: 140 mmol/L (ref 134–144)
Total Protein: 7.3 g/dL (ref 6.0–8.5)

## 2016-01-19 LAB — CBC WITH DIFFERENTIAL/PLATELET
Basophils Absolute: 0 10*3/uL (ref 0.0–0.2)
Basos: 0 %
EOS (ABSOLUTE): 0.3 10*3/uL (ref 0.0–0.4)
EOS: 4 %
HEMATOCRIT: 39.7 % (ref 34.0–46.6)
HEMOGLOBIN: 12.7 g/dL (ref 11.1–15.9)
IMMATURE GRANULOCYTES: 0 %
Immature Grans (Abs): 0 10*3/uL (ref 0.0–0.1)
Lymphocytes Absolute: 1.7 10*3/uL (ref 0.7–3.1)
Lymphs: 24 %
MCH: 27.4 pg (ref 26.6–33.0)
MCHC: 32 g/dL (ref 31.5–35.7)
MCV: 86 fL (ref 79–97)
MONOCYTES: 8 %
MONOS ABS: 0.6 10*3/uL (ref 0.1–0.9)
NEUTROS PCT: 64 %
Neutrophils Absolute: 4.5 10*3/uL (ref 1.4–7.0)
Platelets: 310 10*3/uL (ref 150–379)
RBC: 4.63 x10E6/uL (ref 3.77–5.28)
RDW: 15.8 % — AB (ref 12.3–15.4)
WBC: 7 10*3/uL (ref 3.4–10.8)

## 2016-01-19 LAB — LIPID PANEL
CHOL/HDL RATIO: 3.3 ratio (ref 0.0–4.4)
CHOLESTEROL TOTAL: 161 mg/dL (ref 100–199)
HDL: 49 mg/dL (ref >39)
LDL CALC: 101 mg/dL — AB (ref 0–99)
Triglycerides: 55 mg/dL (ref 0–149)
VLDL CHOLESTEROL CAL: 11 mg/dL (ref 5–40)

## 2016-01-19 LAB — VITAMIN D 25 HYDROXY (VIT D DEFICIENCY, FRACTURES): Vit D, 25-Hydroxy: 18.1 ng/mL — ABNORMAL LOW (ref 30.0–100.0)

## 2016-01-20 ENCOUNTER — Other Ambulatory Visit: Payer: Self-pay | Admitting: Family Medicine

## 2016-01-31 ENCOUNTER — Ambulatory Visit (INDEPENDENT_AMBULATORY_CARE_PROVIDER_SITE_OTHER): Payer: BLUE CROSS/BLUE SHIELD | Admitting: Podiatry

## 2016-01-31 ENCOUNTER — Ambulatory Visit (INDEPENDENT_AMBULATORY_CARE_PROVIDER_SITE_OTHER): Payer: BLUE CROSS/BLUE SHIELD

## 2016-01-31 ENCOUNTER — Encounter: Payer: Self-pay | Admitting: Podiatry

## 2016-01-31 VITALS — BP 180/87 | HR 80 | Resp 18

## 2016-01-31 DIAGNOSIS — M2570 Osteophyte, unspecified joint: Secondary | ICD-10-CM

## 2016-01-31 DIAGNOSIS — R52 Pain, unspecified: Secondary | ICD-10-CM | POA: Diagnosis not present

## 2016-01-31 DIAGNOSIS — M7661 Achilles tendinitis, right leg: Secondary | ICD-10-CM | POA: Diagnosis not present

## 2016-01-31 DIAGNOSIS — M898X7 Other specified disorders of bone, ankle and foot: Secondary | ICD-10-CM

## 2016-01-31 NOTE — Progress Notes (Signed)
   Subjective:    Patient ID: Jennifer Castro, female    DOB: Oct 03, 1955, 60 y.o.   MRN: [id number]  HPI  60 year old female presents the office if her pain and the back of her right heel which is been ongoing since 2014. The pain has been intermittent in nature. No recent injury or trauma. She has tried icing as well as stretching exercises without much relief. The pain is worse when she does lateral walking. She wears open back shoe which also seems to help. She does take Aleve if needed. No other complaints.   Review of Systems  All other systems reviewed and are negative.      Objective:   Physical Exam General: AAO x3, NAD  Dermatological: Skin is warm, dry and supple bilateral. Nails x 10 are well manicured; remaining integument appears unremarkable at this time. There are no open sores, no preulcerative lesions, no rash or signs of infection present.  Vascular: Dorsalis Pedis artery and Posterior Tibial artery pedal pulses are 2/4 bilateral with immedate capillary fill time. Pedal hair growth present. No varicosities and no lower extremity edema present bilateral. There is no pain with calf compression, swelling, warmth, erythema.   Neruologic: Grossly intact via light touch bilateral. Vibratory intact via tuning fork bilateral. Protective threshold with Semmes Wienstein monofilament intact to all pedal sites bilateral.  Musculoskeletal: There is a prominent retrocalcaneal exostosis on the posterior aspect of the right heel and there is tenderness on the distal portion of the Achilles tendon on the insertion of the calcaneus. Equinus is present. There is no defect noted and Thompson test is negative. No one erythema, edema or increase in warmth. MMT 5/5. There is no pain with meals lateral compression of calcaneus.  Gait: Unassisted, Nonantalgic.      Assessment & Plan:  60 year old female retrocalcaneal exostosis, Achilles tendinitis. -Treatment options discussed including all  alternatives, risks, and complications -Etiology of symptoms were discussed -X-rays were obtained and reviewed with the patient. Retrocalcaneal exostosis as present. No evidence of acute fracture. -Dispensed night splint. -Gel sleeve for protection. -Continue voltaren gel. She has not been using this. -Ice -Stretching exercises daily. -Discussed shoe gear changes. -Follow-up as scheduled or sooner if any problems arise. In the meantime, encouraged to call the office with any questions, concerns, change in symptoms.   Celesta Gentile, DPM

## 2016-02-22 ENCOUNTER — Ambulatory Visit (INDEPENDENT_AMBULATORY_CARE_PROVIDER_SITE_OTHER): Payer: BLUE CROSS/BLUE SHIELD | Admitting: Family Medicine

## 2016-02-22 ENCOUNTER — Encounter: Payer: Self-pay | Admitting: Family Medicine

## 2016-02-22 VITALS — BP 150/82 | HR 90 | Temp 98.6°F | Resp 18 | Ht 64.0 in | Wt 283.8 lb

## 2016-02-22 DIAGNOSIS — E1140 Type 2 diabetes mellitus with diabetic neuropathy, unspecified: Secondary | ICD-10-CM | POA: Diagnosis not present

## 2016-02-22 DIAGNOSIS — E785 Hyperlipidemia, unspecified: Secondary | ICD-10-CM

## 2016-02-22 DIAGNOSIS — E1129 Type 2 diabetes mellitus with other diabetic kidney complication: Secondary | ICD-10-CM | POA: Diagnosis not present

## 2016-02-22 DIAGNOSIS — M7661 Achilles tendinitis, right leg: Secondary | ICD-10-CM

## 2016-02-22 DIAGNOSIS — I10 Essential (primary) hypertension: Secondary | ICD-10-CM | POA: Diagnosis not present

## 2016-02-22 DIAGNOSIS — E66813 Obesity, class 3: Secondary | ICD-10-CM

## 2016-02-22 DIAGNOSIS — G4733 Obstructive sleep apnea (adult) (pediatric): Secondary | ICD-10-CM | POA: Diagnosis not present

## 2016-02-22 DIAGNOSIS — M2570 Osteophyte, unspecified joint: Secondary | ICD-10-CM

## 2016-02-22 DIAGNOSIS — Z23 Encounter for immunization: Secondary | ICD-10-CM

## 2016-02-22 DIAGNOSIS — R809 Proteinuria, unspecified: Secondary | ICD-10-CM

## 2016-02-22 DIAGNOSIS — J4520 Mild intermittent asthma, uncomplicated: Secondary | ICD-10-CM

## 2016-02-22 DIAGNOSIS — M898X7 Other specified disorders of bone, ankle and foot: Secondary | ICD-10-CM

## 2016-02-22 LAB — POCT GLYCOSYLATED HEMOGLOBIN (HGB A1C): HEMOGLOBIN A1C: 8.4

## 2016-02-22 LAB — POCT UA - MICROALBUMIN: MICROALBUMIN (UR) POC: 20 mg/L

## 2016-02-22 MED ORDER — LINAGLIPTIN 5 MG PO TABS: 5.0000 mg | ORAL_TABLET | Freq: Every day | ORAL | Status: DC

## 2016-02-22 NOTE — Progress Notes (Addendum)
Name: Jennifer Castro   MRN: [id number]    DOB: 10/21/1955   Date:02/22/2016       Progress Note  Subjective  Chief Complaint  Chief Complaint  Patient presents with  . Asthma    Patient has been having to use her inhaler 1-2 weekly the past couple of weeks, which is unusual for her. Patient states some cologne aggravates her asthma   . Sleep Apnea  . Hyperlipidemia  . Obesity    Patient has gained a pound since last visit, but has been trying to walk more often  . Hypertension    Patient swelling in her ankles have been improving   . Diabetes    Checks BS 2x weekly, L-99 Averages in the 100's High-168  . Medication Refill    3 month F/U    HPI  HTN: she has been back on Caduet and Valsartan/HCTZ daily. BP is elevated but usually normal at home.  No chest pain or palpitation , she has not been checking bp at home but denies orthostatic hypotension symptoms.    DM: she has neuropathy and also proteinuria. On ARB, paresthesia on her right foot has improved. She is tired of side effects of Metformin - can't take when she travels because of diarrhea. Discussed options and we will try another agent. No polyphagia, polydipsia or polyuria. HgbA1C has gone up from 7.1% to 8.4% - explained the importance of resuming a diabetic diet.   Morbid Obesity: she has no lost any weight since last visit. She has not been very compliant with her diet lately.   Asthma Mild intermittent: she has been doing well, wheezing , she has mild cough (but she states feels like from post-nasal drainage and not from her chest). She is not using Advair or rescue inhaler.   Sleep Apnea: she needs to go for another sleep study, she could not stay asleep during the whole night, may need Ambien before next study.   Hyperlipidemia: taking Caduet and denies side effects of medication , reviewed labs with patient.   Retrocalcaneal exostosis/right achilles tendinitis: seeing Dr. Marice Potter, she continues to have pain on right  heel, wearing a boot at night and seems to help the symptoms.    Patient Active Problem List   Diagnosis Date Noted  . Retrocalcaneal exostosis 02/22/2016  . Right Achilles tendinitis 02/22/2016  . Microalbuminuria due to type 2 diabetes mellitus ([place]) 08/22/2015  . Perennial allergic rhinitis 08/15/2015  . Hypertension, benign 05/22/2015  . Hyperlipidemia 05/22/2015  . GERD (gastroesophageal reflux disease) 05/22/2015  . Diabetes mellitus with neuropathy ([place]) 05/22/2015  . Mild intermittent asthma 05/22/2015  . OSA (obstructive sleep apnea) 05/22/2015  . Obesity, Class III, BMI 40-49.9 (morbid obesity) ([place]) 05/22/2015  . Vitamin D deficiency 05/22/2015  . Chronic constipation 05/22/2015  . Vitiligo 05/22/2015    Past Surgical History  Procedure Laterality Date  . Supracervical abdominal hysterectomy      without BSO fibroid tumors  . Cardiac catheterization  [place]  . Colonoscopy with propofol N/A 11/09/2015    Procedure: COLONOSCOPY WITH PROPOFOL;  Surgeon: Josefine Class, MD;  Location: [facility] ENDOSCOPY;  Service: Endoscopy;  Laterality: N/A;    Family History  Problem Relation Age of Onset  . Heart attack Mother     Social History   Social History  . Marital Status: Married    Spouse Name: N/A  . Number of Children: N/A  . Years of Education: N/A   Occupational  History  . Not on file.   Social History Main Topics  . Smoking status: Former Smoker -- 0.50 packs/day for 10 years    Types: Cigarettes    Start date: 09/15/1969    Quit date: 05/21/1980  . Smokeless tobacco: Never Used  . Alcohol Use: No  . Drug Use: No  . Sexual Activity: Not Currently   Other Topics Concern  . Not on file   Social History Narrative     Current outpatient prescriptions:  .  albuterol (PROVENTIL HFA;VENTOLIN HFA) 108 (90 BASE) MCG/ACT inhaler, Inhale 2 puffs into the lungs every 6 (six) hours as needed for wheezing or shortness of breath., Disp: 1  Inhaler, Rfl: 0 .  amLODipine-atorvastatin (CADUET) 10-40 MG tablet, Take 1 tablet by mouth daily., Disp: 90 tablet, Rfl: 1 .  aspirin 81 MG tablet, Take 81 mg by mouth daily., Disp: , Rfl:  .  Ca Carbonate-Mag Hydroxide 550-110 MG CHEW, Chew by mouth., Disp: , Rfl:  .  diclofenac sodium (VOLTAREN) 1 % GEL, Apply topically as needed. , Disp: , Rfl:  .  fluticasone (FLONASE) 50 MCG/ACT nasal spray, Place 2 sprays into both nostrils daily., Disp: 16 g, Rfl: 6 .  POLYETHYLENE GLYCOL 3350 PO, Take by mouth as needed. Mix with 8oz fluid., Disp: , Rfl:  .  valsartan-hydrochlorothiazide (DIOVAN-HCT) 320-25 MG tablet, Take 1 tablet by mouth daily., Disp: 90 tablet, Rfl: 1 .  Vitamin D, Ergocalciferol, (DRISDOL) 50000 units CAPS capsule, Take 1 capsule (50,000 Units total) by mouth every 7 (seven) days., Disp: 12 capsule, Rfl: 0  Allergies  Allergen Reactions  . Ace Inhibitors      ROS  Constitutional: Negative for fever, positive for mild  weight change.  Respiratory: positive  for mild cough from post-nasal drainage, she has shortness of breath with activity.   Cardiovascular: Negative for chest pain or palpitations.  Gastrointestinal: Negative for abdominal pain, no bowel changes.  Musculoskeletal: Negative for gait problem or joint swelling.  Skin: Negative for rash.  Neurological: Negative for dizziness or headache.  No other specific complaints in a complete review of systems (except as listed in HPI above).  Objective  Filed Vitals:   02/22/16 1614 02/22/16 1642  BP: 152/76 150/82  Pulse: 90   Temp: 98.6 F (37 C)   TempSrc: Oral   Resp: 18   Height: 5\' 4"  (1.626 m)   Weight: 283 lb 12.8 oz (128.731 kg)   SpO2: 98%     Body mass index is 48.69 kg/(m^2).  Physical Exam   Constitutional: Patient appears well-developed and well-nourished. Obese  No distress.  HEENT: head atraumatic, normocephalic, pupils equal and reactive to light,  neck supple, throat within normal  limits Cardiovascular: Normal rate, regular rhythm and normal heart sounds.  No murmur heard. 1 plus BLE edema. Pulmonary/Chest: Effort normal and breath sounds normal. No respiratory distress. Abdominal: Soft.  There is no tenderness. Psychiatric: Patient has a normal mood and affect. behavior is normal. Judgment and thought content normal.  Recent Results (from the past 2160 hour(s))  Lipid panel     Status: Abnormal   Collection Time: 01/18/16  9:58 AM  Result Value Ref Range   Cholesterol, Total 161 100 - 199 mg/dL   Triglycerides 55 0 - 149 mg/dL   HDL 49 >39 mg/dL   VLDL Cholesterol Cal 11 5 - 40 mg/dL   LDL Calculated 101 (H) 0 - 99 mg/dL   Chol/HDL Ratio 3.3 0.0 - 4.4 ratio units  Comment:                                   T. Chol/HDL Ratio                                             Men  Women                               1/2 Avg.Risk  3.4    3.3                                   Avg.Risk  5.0    4.4                                2X Avg.Risk  9.6    7.1                                3X Avg.Risk 23.4   11.0   Comprehensive metabolic panel     Status: Abnormal   Collection Time: 01/18/16  9:58 AM  Result Value Ref Range   Glucose 125 (H) 65 - 99 mg/dL   BUN 12 8 - 27 mg/dL   Creatinine, Ser 0.71 0.57 - 1.00 mg/dL   GFR calc non Af Amer 93 >59 mL/min/1.73   GFR calc Af Amer 107 >59 mL/min/1.73   BUN/Creatinine Ratio 17 12 - 28   Sodium 140 134 - 144 mmol/L   Potassium 3.8 3.5 - 5.2 mmol/L   Chloride 96 96 - 106 mmol/L   CO2 22 18 - 29 mmol/L   Calcium 9.4 8.7 - 10.3 mg/dL   Total Protein 7.3 6.0 - 8.5 g/dL   Albumin 4.1 3.6 - 4.8 g/dL   Globulin, Total 3.2 1.5 - 4.5 g/dL   Albumin/Globulin Ratio 1.3 1.2 - 2.2   Bilirubin Total 0.3 0.0 - 1.2 mg/dL   Alkaline Phosphatase 99 39 - 117 IU/L   AST 16 0 - 40 IU/L   ALT 15 0 - 32 IU/L  VITAMIN D 25 Hydroxy (Vit-D Deficiency, Fractures)     Status: Abnormal   Collection Time: 01/18/16  9:58 AM  Result Value Ref Range    Vit D, 25-Hydroxy 18.1 (L) 30.0 - 100.0 ng/mL    Comment: Vitamin D deficiency has been defined by the Institute of Medicine and an Endocrine Society practice guideline as a level of serum 25-OH vitamin D less than 20 ng/mL (1,2). The Endocrine Society went on to further define vitamin D insufficiency as a level between 21 and 29 ng/mL (2). 1. IOM (Institute of Medicine). 2010. Dietary reference    intakes for calcium and D. [place]: The    [employer]. 2. Holick MF, Binkley [place], Bischoff-Ferrari HA, et al.    Evaluation, treatment, and prevention of vitamin D    deficiency: an Endocrine Society clinical practice    guideline. JCEM. 2011 Jul; 96(7):1911-30.   CBC with Differential/Platelet     Status: Abnormal   Collection Time: 01/18/16  9:58 AM  Result  Value Ref Range   WBC 7.0 3.4 - 10.8 x10E3/uL   RBC 4.63 3.77 - 5.28 x10E6/uL   Hemoglobin 12.7 11.1 - 15.9 g/dL   Hematocrit 39.7 34.0 - 46.6 %   MCV 86 79 - 97 fL   MCH 27.4 26.6 - 33.0 pg   MCHC 32.0 31.5 - 35.7 g/dL   RDW 15.8 (H) 12.3 - 15.4 %   Platelets 310 150 - 379 x10E3/uL   Neutrophils 64 %   Lymphs 24 %   Monocytes 8 %   Eos 4 %   Basos 0 %   Neutrophils Absolute 4.5 1.4 - 7.0 x10E3/uL   Lymphocytes Absolute 1.7 0.7 - 3.1 x10E3/uL   Monocytes Absolute 0.6 0.1 - 0.9 x10E3/uL   EOS (ABSOLUTE) 0.3 0.0 - 0.4 x10E3/uL   Basophils Absolute 0.0 0.0 - 0.2 x10E3/uL   Immature Granulocytes 0 %   Immature Grans (Abs) 0.0 0.0 - 0.1 x10E3/uL  POCT glycosylated hemoglobin (Hb A1C)     Status: Abnormal   Collection Time: 02/22/16  4:25 PM  Result Value Ref Range   Hemoglobin A1C 8.4   POCT UA - Microalbumin     Status: None   Collection Time: 02/22/16  4:32 PM  Result Value Ref Range   Microalbumin Ur, POC 20 mg/L   Creatinine, POC  mg/dL   Albumin/Creatinine Ratio, Urine, POC       PHQ2/9: Depression screen [facility] 2/9 01/18/2016 11/22/2015 08/15/2015 05/22/2015  Decreased Interest 0 0 0 0  Down,  Depressed, Hopeless 0 0 0 0  PHQ - 2 Score 0 0 0 0     Fall Risk: Fall Risk  01/18/2016 11/22/2015 08/15/2015 05/22/2015  Falls in the past year? No No No No     Assessment & Plan  1. Type 2 diabetes mellitus with diabetic neuropathy, without long-term current use of insulin (HCC)  She states her diet is not any worse, she states she has not been taking Metformin on a regular basis because of side effect - POCT glycosylated hemoglobin (Hb A1C) 8.4% - POCT UA - Microalbumin 20 - linagliptin (TRADJENTA) 5 MG TABS tablet; Take 1 tablet (5 mg total) by mouth daily.  Dispense: 30 tablet; Refill: 2  2. Hypertension, benign  bp is up today , but normal when she saw Podiatrist, we will add Tradjenta for DM and may also help with bp , monitor and follow up in one month  3. Hyperlipidemia  Continue medication   4. Mild intermittent asthma, uncomplicated  Doing better, no wheezing or SOB, taking albuterol prn   5. Obesity, Class III, BMI 40-49.9 (morbid obesity) ([place])  Discussed with the patient the risk posed by an increased BMI. Discussed importance of portion control, calorie counting and at least 150 minutes of physical activity weekly. Avoid sweet beverages and drink more water. Eat at least 6 servings of fruit and vegetables daily   6. OSA (obstructive sleep apnea)  She needs to repeat sleep study with titration, she will call back when ready to have it done and we will call in Ambien before the study  7. Microalbuminuria due to type 2 diabetes mellitus (HCC)  Continue ARB  8. Need for pneumococcal vaccination  - Pneumococcal conjugate vaccine 13-valent IM  9. Retrocalcaneal exostosis   10. Right Achilles tendinitis  Continue current regiment by Podiatrist

## 2016-03-02 ENCOUNTER — Other Ambulatory Visit: Payer: Self-pay | Admitting: Family Medicine

## 2016-03-13 ENCOUNTER — Ambulatory Visit (INDEPENDENT_AMBULATORY_CARE_PROVIDER_SITE_OTHER): Payer: BLUE CROSS/BLUE SHIELD | Admitting: Podiatry

## 2016-03-13 DIAGNOSIS — M7661 Achilles tendinitis, right leg: Secondary | ICD-10-CM

## 2016-03-13 DIAGNOSIS — M898X7 Other specified disorders of bone, ankle and foot: Secondary | ICD-10-CM

## 2016-03-13 DIAGNOSIS — M2570 Osteophyte, unspecified joint: Secondary | ICD-10-CM

## 2016-03-13 MED ORDER — DICLOFENAC SODIUM 75 MG PO TBEC: 75.0000 mg | DELAYED_RELEASE_TABLET | Freq: Two times a day (BID) | ORAL | Status: DC

## 2016-03-13 NOTE — Progress Notes (Signed)
Patient ID: Jennifer Castro, female   DOB: 06-Jun-1956, 60 y.o.   MRN: [id number]  Subjective: 60 year old female presents the office in follow-up evaluation of right posterior heel pain. She states that she was doing well until about 4 days ago when she was stretching she feels that she stretch at 24 A she had increased pain and swelling to the area. She denied. Any pop. The pain has decreased our she does continue to have pain to this area. No other injury. No other complaints at this time.   Objective: AAO x3, NAD DP/PT pulses palpable bilaterally, CRT less than 3 seconds On the posterior aspect of the right heel on the insertion of the Achilles onto the calcaneus is mild discomfort. There is also some mild discomfort on the mid substance of the Achilles tendon. There is no defect noted and Thompson test is negative. There is no edema, erythema. No erythema or increase in warmth. No other areas of tenderness to bilateral lower extremities. No areas of pinpoint bony tenderness or pain with vibratory sensation. MMT 5/5, ROM WNL. No edema, erythema, increase in warmth to bilateral lower extremities.  No open lesions or pre-ulcerative lesions.  No pain with calf compression, swelling, warmth, erythema  Assessment: Achilles tendinitis, retrocalcaneal exostosis right foot  Plan: -All treatment options discussed with the patient including all alternatives, risks, complications.  -At this time given her increase in pain we'll place into a cam boot. She has this at home and I recommended her to wear this. -Ice to the area. -Prescribed voltaren. Discussed side effects of the medication and directed to stop if any are to occur and call the office.  -Limit activity. -Discussed that if symptoms continue physical therapy referral. Also discuss surgical intervention in the future. -Follow-up 3 weeks or sooner if needed. -Patient encouraged to call the office with any questions, concerns, change in symptoms.    Celesta Gentile, DPM

## 2016-03-28 ENCOUNTER — Ambulatory Visit: Payer: BLUE CROSS/BLUE SHIELD | Admitting: Family Medicine

## 2016-03-31 ENCOUNTER — Encounter: Payer: Self-pay | Admitting: Family Medicine

## 2016-03-31 ENCOUNTER — Ambulatory Visit (INDEPENDENT_AMBULATORY_CARE_PROVIDER_SITE_OTHER): Payer: BLUE CROSS/BLUE SHIELD | Admitting: Family Medicine

## 2016-03-31 VITALS — BP 118/70 | HR 84 | Temp 98.4°F | Resp 16 | Ht 64.0 in | Wt 272.2 lb

## 2016-03-31 DIAGNOSIS — E1140 Type 2 diabetes mellitus with diabetic neuropathy, unspecified: Secondary | ICD-10-CM

## 2016-03-31 DIAGNOSIS — I10 Essential (primary) hypertension: Secondary | ICD-10-CM

## 2016-03-31 NOTE — Progress Notes (Signed)
Name: Jennifer Castro   MRN: [id number]    DOB: 11/29/1955   Date:03/31/2016       Progress Note  Subjective  Chief Complaint  Chief Complaint  Patient presents with  . Follow-up    1 month   . Diabetes    Patient start Tradjenta on last visit and made her sugar better managed but she could not tolerate side effects of upper abdominal pain for 2 weeks before stopping.   Marland Kitchen Hypertension    Denies any symptoms has been checking her BP at home and getting 129/82    HPI  HTN: she has been back on Caduet and Valsartan/HCTZ daily. BP was elevated on her last visit, but is back to normal today.  No chest pain or palpitation , no orthostatic changes  DM: she has neuropathy and also proteinuria. On ARB, paresthesia on her right foot has improved. She was tired of side effects of Metformin - can't take when she travels because of diarrhea. We changed to Trajenta on her last visit, but on day three of medication she developed epigastric pain - no vomiting or fever. No polyphagia, polydipsia or polyuria. HgbA1C has gone up from 7.1% to 8.4%  on her last visit, she has been more strict with her diet, avoiding carbohydrates and when she has a sweet is one cookie instead of 5-6 cookies, eating more fruit and vegetables.     Patient Active Problem List   Diagnosis Date Noted  . Retrocalcaneal exostosis 02/22/2016  . Right Achilles tendinitis 02/22/2016  . Microalbuminuria due to type 2 diabetes mellitus ([place]) 08/22/2015  . Perennial allergic rhinitis 08/15/2015  . Hypertension, benign 05/22/2015  . Hyperlipidemia 05/22/2015  . GERD (gastroesophageal reflux disease) 05/22/2015  . Diabetes mellitus with neuropathy ([place]) 05/22/2015  . Mild intermittent asthma 05/22/2015  . OSA (obstructive sleep apnea) 05/22/2015  . Obesity, Class III, BMI 40-49.9 (morbid obesity) ([place]) 05/22/2015  . Vitamin D deficiency 05/22/2015  . Chronic constipation 05/22/2015  . Vitiligo 05/22/2015    Past Surgical History   Procedure Laterality Date  . Supracervical abdominal hysterectomy      without BSO fibroid tumors  . Cardiac catheterization  [place]  . Colonoscopy with propofol N/A 11/09/2015    Procedure: COLONOSCOPY WITH PROPOFOL;  Surgeon: Josefine Class, MD;  Location: [facility] ENDOSCOPY;  Service: Endoscopy;  Laterality: N/A;    Family History  Problem Relation Age of Onset  . Heart attack Mother     Social History   Social History  . Marital Status: Married    Spouse Name: N/A  . Number of Children: N/A  . Years of Education: N/A   Occupational History  . Not on file.   Social History Main Topics  . Smoking status: Former Smoker -- 0.50 packs/day for 10 years    Types: Cigarettes    Start date: 09/15/1969    Quit date: 05/21/1980  . Smokeless tobacco: Never Used  . Alcohol Use: No  . Drug Use: No  . Sexual Activity: Not Currently   Other Topics Concern  . Not on file   Social History Narrative     Current outpatient prescriptions:  .  albuterol (PROVENTIL HFA;VENTOLIN HFA) 108 (90 BASE) MCG/ACT inhaler, Inhale 2 puffs into the lungs every 6 (six) hours as needed for wheezing or shortness of breath., Disp: 1 Inhaler, Rfl: 0 .  amLODipine-atorvastatin (CADUET) 10-40 MG tablet, Take 1 tablet by mouth daily., Disp: 90 tablet, Rfl: 1 .  aspirin 81 MG tablet, Take 81 mg by mouth daily., Disp: , Rfl:  .  Ca Carbonate-Mag Hydroxide 550-110 MG CHEW, Chew by mouth., Disp: , Rfl:  .  diclofenac sodium (VOLTAREN) 1 % GEL, Apply topically as needed. , Disp: , Rfl:  .  Fluticasone-Salmeterol (ADVAIR DISKUS) 250-50 MCG/DOSE AEPB, Inhale into the lungs., Disp: , Rfl:  .  linagliptin (TRADJENTA) 5 MG TABS tablet, Take 1 tablet (5 mg total) by mouth daily., Disp: 30 tablet, Rfl: 2 .  montelukast (SINGULAIR) 10 MG tablet, Take by mouth., Disp: , Rfl:  .  POLYETHYLENE GLYCOL 3350 PO, Take by mouth as needed. Mix with 8oz fluid., Disp: , Rfl:  .  valsartan-hydrochlorothiazide  (DIOVAN-HCT) 320-25 MG tablet, Take 1 tablet by mouth daily., Disp: 90 tablet, Rfl: 1 .  Vitamin D, Ergocalciferol, (DRISDOL) 50000 units CAPS capsule, TAKE 1 CAPSULE EVERY 7 DAYS, Disp: 12 capsule, Rfl: 0  Allergies  Allergen Reactions  . Ace Inhibitors      ROS  Ten systems reviewed and is negative except as mentioned in HPI   Objective  Filed Vitals:   03/31/16 1403  BP: 118/70  Pulse: 84  Temp: 98.4 F (36.9 C)  TempSrc: Oral  Resp: 16  Height: 5\' 4"  (1.626 m)  Weight: 272 lb 3.2 oz (123.469 kg)  SpO2: 95%    Body mass index is 46.7 kg/(m^2).  Physical Exam  Constitutional: Patient appears well-developed and well-nourished. Obese  No distress.  HEENT: head atraumatic, normocephalic, pupils equal and reactive to light,neck supple, throat within normal limits Cardiovascular: Normal rate, regular rhythm and normal heart sounds.  No murmur heard. No BLE edema. Pulmonary/Chest: Effort normal and breath sounds normal. No respiratory distress. Abdominal: Soft.  There is no tenderness. Psychiatric: Patient has a normal mood and affect. behavior is normal. Judgment and thought content normal. Skin; erythematous macules and some areas with papules, itchy, same lesions since yesterday and was in her yard, likely insect bites, can try topical hydrocortisone prn   Recent Results (from the past 2160 hour(s))  Lipid panel     Status: Abnormal   Collection Time: 01/18/16  9:58 AM  Result Value Ref Range   Cholesterol, Total 161 100 - 199 mg/dL   Triglycerides 55 0 - 149 mg/dL   HDL 49 >39 mg/dL   VLDL Cholesterol Cal 11 5 - 40 mg/dL   LDL Calculated 101 (H) 0 - 99 mg/dL   Chol/HDL Ratio 3.3 0.0 - 4.4 ratio units    Comment:                                   T. Chol/HDL Ratio                                             Men  Women                               1/2 Avg.Risk  3.4    3.3                                   Avg.Risk  5.0    4.4  2X  Avg.Risk  9.6    7.1                                3X Avg.Risk 23.4   11.0   Comprehensive metabolic panel     Status: Abnormal   Collection Time: 01/18/16  9:58 AM  Result Value Ref Range   Glucose 125 (H) 65 - 99 mg/dL   BUN 12 8 - 27 mg/dL   Creatinine, Ser 0.71 0.57 - 1.00 mg/dL   GFR calc non Af Amer 93 >59 mL/min/1.73   GFR calc Af Amer 107 >59 mL/min/1.73   BUN/Creatinine Ratio 17 12 - 28   Sodium 140 134 - 144 mmol/L   Potassium 3.8 3.5 - 5.2 mmol/L   Chloride 96 96 - 106 mmol/L   CO2 22 18 - 29 mmol/L   Calcium 9.4 8.7 - 10.3 mg/dL   Total Protein 7.3 6.0 - 8.5 g/dL   Albumin 4.1 3.6 - 4.8 g/dL   Globulin, Total 3.2 1.5 - 4.5 g/dL   Albumin/Globulin Ratio 1.3 1.2 - 2.2   Bilirubin Total 0.3 0.0 - 1.2 mg/dL   Alkaline Phosphatase 99 39 - 117 IU/L   AST 16 0 - 40 IU/L   ALT 15 0 - 32 IU/L  VITAMIN D 25 Hydroxy (Vit-D Deficiency, Fractures)     Status: Abnormal   Collection Time: 01/18/16  9:58 AM  Result Value Ref Range   Vit D, 25-Hydroxy 18.1 (L) 30.0 - 100.0 ng/mL    Comment: Vitamin D deficiency has been defined by the Institute of Medicine and an Endocrine Society practice guideline as a level of serum 25-OH vitamin D less than 20 ng/mL (1,2). The Endocrine Society went on to further define vitamin D insufficiency as a level between 21 and 29 ng/mL (2). 1. IOM (Institute of Medicine). 2010. Dietary reference    intakes for calcium and D. [place]: The    [employer]. 2. Holick MF, Binkley [place], Bischoff-Ferrari HA, et al.    Evaluation, treatment, and prevention of vitamin D    deficiency: an Endocrine Society clinical practice    guideline. JCEM. 2011 Jul; 96(7):1911-30.   CBC with Differential/Platelet     Status: Abnormal   Collection Time: 01/18/16  9:58 AM  Result Value Ref Range   WBC 7.0 3.4 - 10.8 x10E3/uL   RBC 4.63 3.77 - 5.28 x10E6/uL   Hemoglobin 12.7 11.1 - 15.9 g/dL   Hematocrit 39.7 34.0 - 46.6 %   MCV 86 79 - 97 fL   MCH  27.4 26.6 - 33.0 pg   MCHC 32.0 31.5 - 35.7 g/dL   RDW 15.8 (H) 12.3 - 15.4 %   Platelets 310 150 - 379 x10E3/uL   Neutrophils 64 %   Lymphs 24 %   Monocytes 8 %   Eos 4 %   Basos 0 %   Neutrophils Absolute 4.5 1.4 - 7.0 x10E3/uL   Lymphocytes Absolute 1.7 0.7 - 3.1 x10E3/uL   Monocytes Absolute 0.6 0.1 - 0.9 x10E3/uL   EOS (ABSOLUTE) 0.3 0.0 - 0.4 x10E3/uL   Basophils Absolute 0.0 0.0 - 0.2 x10E3/uL   Immature Granulocytes 0 %   Immature Grans (Abs) 0.0 0.0 - 0.1 x10E3/uL  POCT glycosylated hemoglobin (Hb A1C)     Status: Abnormal   Collection Time: 02/22/16  4:25 PM  Result Value Ref Range   Hemoglobin A1C 8.4  POCT UA - Microalbumin     Status: None   Collection Time: 02/22/16  4:32 PM  Result Value Ref Range   Microalbumin Ur, POC 20 mg/L   Creatinine, POC  mg/dL   Albumin/Creatinine Ratio, Urine, POC       PHQ2/9: Depression screen [facility] 2/9 03/31/2016 01/18/2016 11/22/2015 08/15/2015 05/22/2015  Decreased Interest 0 0 0 0 0  Down, Depressed, Hopeless 0 0 0 0 0  PHQ - 2 Score 0 0 0 0 0     Fall Risk: Fall Risk  03/31/2016 01/18/2016 11/22/2015 08/15/2015 05/22/2015  Falls in the past year? No No No No No     Functional Status Survey: Is the patient deaf or have difficulty hearing?: No Does the patient have difficulty seeing, even when wearing glasses/contacts?: No Does the patient have difficulty concentrating, remembering, or making decisions?: No Does the patient have difficulty walking or climbing stairs?: No Does the patient have difficulty dressing or bathing?: No Does the patient have difficulty doing errands alone such as visiting a doctor's office or shopping?: No    Assessment & Plan  1. Type 2 diabetes mellitus with diabetic neuropathy, without long-term current use of insulin ([place])  Advised to try it again, and if pain returns to stop and call us back -we will avoid metformin because of diarrhea, we can try Xigduo if not tolerated  2. Hypertension,  benign  At goal, doing well, continue medication and life style modification

## 2016-04-03 ENCOUNTER — Encounter: Payer: Self-pay | Admitting: Podiatry

## 2016-04-03 ENCOUNTER — Ambulatory Visit (INDEPENDENT_AMBULATORY_CARE_PROVIDER_SITE_OTHER): Payer: BLUE CROSS/BLUE SHIELD | Admitting: Podiatry

## 2016-04-03 DIAGNOSIS — M719 Bursopathy, unspecified: Secondary | ICD-10-CM

## 2016-04-03 DIAGNOSIS — M7661 Achilles tendinitis, right leg: Secondary | ICD-10-CM

## 2016-04-03 DIAGNOSIS — M898X7 Other specified disorders of bone, ankle and foot: Secondary | ICD-10-CM

## 2016-04-03 DIAGNOSIS — M2570 Osteophyte, unspecified joint: Secondary | ICD-10-CM

## 2016-04-03 NOTE — Progress Notes (Signed)
Patient ID: Jennifer Castro, female   DOB: 1956/06/05, 60 y.o.   MRN: [id number]  Subjective:  60 year old female presents the opposite a pop evaluation of right posterior heel pain. She states that she has been stretching as well as use a night splint is his been helping some but she continues to have pain in the back of her heels also swelling. Denies any redness or warmth. No recent injury or trauma. No other complaints at this time and no new concerns.  Objective: AAO x3, NAD DP/PT pulses palpable bilaterally, CRT less than 3 seconds On the posterior aspect of the right heel on the insertion of the Achilles tendon. There does appear to be some mild localized edema and erythema around this area as well. Thompson test is negative the Achilles tendon appears to be intact. No pain with lateral compression of the calcaneus. Equinus is present. No other areas of tenderness bilaterally. No open lesions or pre-ulcerative lesions.  No pain with calf compression, swelling, warmth, erythema  Assessment: Achilles tendinitis, retrocalcaneal exostosis, burisitis right foot  Plan: -All treatment options discussed with the patient including all alternatives, risks, complications.  -Continues to have pain. I discussed with her small and a steroid injection to the area due to the swelling as well as discomfort and she wishes to proceed. She understands risks, complications including tendon rupture and she understands this and wishes to proceed. She wishes to hold off on surgery or physical therapy at this point. Under sterile conditions a small on a Dexon is an phosphate and local anesthetic was infiltrated into the area of maximal tenderness. Care was taken not to inject directly into the Achilles tendon. This was done along the area of a small bursa/fluid. -Must wear cam boot at all times. Dispensed short cam boot with a heel lift. -Ice to the area. -Follow-up 3 weeks or sooner if needed.  Celesta Gentile,  DPM

## 2016-04-24 ENCOUNTER — Ambulatory Visit (INDEPENDENT_AMBULATORY_CARE_PROVIDER_SITE_OTHER): Payer: BLUE CROSS/BLUE SHIELD | Admitting: Podiatry

## 2016-04-24 ENCOUNTER — Encounter: Payer: Self-pay | Admitting: Podiatry

## 2016-04-24 DIAGNOSIS — M7661 Achilles tendinitis, right leg: Secondary | ICD-10-CM

## 2016-04-24 DIAGNOSIS — M2570 Osteophyte, unspecified joint: Secondary | ICD-10-CM

## 2016-04-24 DIAGNOSIS — M898X7 Other specified disorders of bone, ankle and foot: Secondary | ICD-10-CM

## 2016-04-24 NOTE — Progress Notes (Signed)
Patient ID: Jennifer Castro, female   DOB: 10/29/1955, 60 y.o.   MRN: [id number]  Subjective:  60 year old female presents to the office they for follow-up evaluation of right posterior heel pain. She said the injections helped the pain has significantly improved although she still feels tightness to the area. She did wear the boot for 13 days after her last appointment. She denies any recent injury or trauma. Her swelling has decreased. No other complaints at this time in no acute changes otherwise. No new concerns.  Objective: AAO x3, NAD DP/PT pulses palpable bilaterally, CRT less than 3 seconds On the posterior aspect of the right heel on the insertion of the Achilles tendon there is no significant tenderness at this time. There is no amount edema, erythema, increase in warmth. Positive test is negative. No defect noted. Equinus is present. Overall she states the area feels stiff. No open lesions or pre-ulcerative lesions.  No pain with calf compression, swelling, warmth, erythema  Assessment: Achilles tendinitis, retrocalcaneal exostosis, equinus   Plan: -All treatment options discussed with the patient including all alternatives, risks, complications.  -At this time I recommended her to continue with at-home range of motion, rehabilitation exercises as well as night splint. Also referred to physical therapy to help with a home treatment program. She agrees to this. Anti-inflammatories as needed. Ice to the area. Follow-up in 6 weeks or sooner if any issues are to arise. Encouraged to call any questions, concerns or any change in symptoms in the meantime.  Celesta Gentile, DPM

## 2016-05-25 ENCOUNTER — Other Ambulatory Visit: Payer: Self-pay | Admitting: Family Medicine

## 2016-05-26 ENCOUNTER — Encounter: Payer: Self-pay | Admitting: Family Medicine

## 2016-05-26 ENCOUNTER — Ambulatory Visit (INDEPENDENT_AMBULATORY_CARE_PROVIDER_SITE_OTHER): Payer: BLUE CROSS/BLUE SHIELD | Admitting: Family Medicine

## 2016-05-26 VITALS — BP 134/82 | HR 83 | Temp 97.8°F | Resp 16 | Ht 64.0 in | Wt 265.5 lb

## 2016-05-26 DIAGNOSIS — M7661 Achilles tendinitis, right leg: Secondary | ICD-10-CM | POA: Diagnosis not present

## 2016-05-26 DIAGNOSIS — E785 Hyperlipidemia, unspecified: Secondary | ICD-10-CM

## 2016-05-26 DIAGNOSIS — E1140 Type 2 diabetes mellitus with diabetic neuropathy, unspecified: Secondary | ICD-10-CM | POA: Diagnosis not present

## 2016-05-26 DIAGNOSIS — E1129 Type 2 diabetes mellitus with other diabetic kidney complication: Secondary | ICD-10-CM | POA: Diagnosis not present

## 2016-05-26 DIAGNOSIS — R809 Proteinuria, unspecified: Secondary | ICD-10-CM | POA: Diagnosis not present

## 2016-05-26 DIAGNOSIS — J4520 Mild intermittent asthma, uncomplicated: Secondary | ICD-10-CM | POA: Diagnosis not present

## 2016-05-26 DIAGNOSIS — I10 Essential (primary) hypertension: Secondary | ICD-10-CM | POA: Diagnosis not present

## 2016-05-26 DIAGNOSIS — G4733 Obstructive sleep apnea (adult) (pediatric): Secondary | ICD-10-CM | POA: Diagnosis not present

## 2016-05-26 LAB — POCT GLYCOSYLATED HEMOGLOBIN (HGB A1C): Hemoglobin A1C: 8

## 2016-05-26 MED ORDER — VALSARTAN-HYDROCHLOROTHIAZIDE 320-25 MG PO TABS: 1.0000 | ORAL_TABLET | Freq: Every day | ORAL | 1 refills | Status: DC

## 2016-05-26 MED ORDER — AMLODIPINE-ATORVASTATIN 10-40 MG PO TABS: 1.0000 | ORAL_TABLET | Freq: Every day | ORAL | 1 refills | Status: DC

## 2016-05-26 MED ORDER — LINAGLIPTIN 5 MG PO TABS: 5.0000 mg | ORAL_TABLET | Freq: Every day | ORAL | 2 refills | Status: DC

## 2016-05-26 NOTE — Progress Notes (Signed)
Name: Jennifer Castro   MRN: [id number]    DOB: 1956-05-04   Date:05/26/2016       Progress Note  Subjective  Chief Complaint  Chief Complaint  Patient presents with  . Medication Refill    2 month F/U  . Diabetes    Changed medication from Metformin to Tradjenta, BS have been doing better L-93 Average-124 High-154 patient has already ate 7 pounds since last visit.  Marland Kitchen Hypertension    Checks BP at work 122/74  . Hyperlipidemia    Joint pain and cramping legs and hands a little more.   . Constipation    Takes Miralax as needed    HPI    HTN: she has been back on Caduet and Valsartan/HCTZ daily. BP is at goal, during health screening at work bp was 122/74. She denies side effects of medication   DM: she has neuropathy and also proteinuria. On ARB, paresthesia on her right foot has improved. She was tired of side effects of Metformin - can't take when she travels because of diarrhea. We changed to Trajenta Spring of 2017. No polyphagia, polydipsia or polyuria. HgbA1C has gone up from 7.1% to 8.4%  on her last visit and now it is 8.0%. Average glucose at home for the past month 130's.  she has been more strict with her diet, avoiding carbohydrates and when she has a sweet is one cookie instead of 5-6 cookies, no longer having sweet tea. Eating at least 4 cups of fruit and vegetables daily and she will try to increase amount. She is surprise that her hgbA1C is above goal.   Hyperlipidemia: she had labs done at work and LDL is down from 101 to 70, she is on Caduet and denies side effects of medication  Constipation: doing much better since she increased fiber in her diet  Patient Active Problem List   Diagnosis Date Noted  . Retrocalcaneal exostosis 02/22/2016  . Right Achilles tendinitis 02/22/2016  . Microalbuminuria due to type 2 diabetes mellitus ([place]) 08/22/2015  . Perennial allergic rhinitis 08/15/2015  . Hypertension, benign 05/22/2015  . Hyperlipidemia 05/22/2015  . GERD  (gastroesophageal reflux disease) 05/22/2015  . Diabetes mellitus with neuropathy ([place]) 05/22/2015  . Mild intermittent asthma 05/22/2015  . OSA (obstructive sleep apnea) 05/22/2015  . Obesity, Class III, BMI 40-49.9 (morbid obesity) ([place]) 05/22/2015  . Vitamin D deficiency 05/22/2015  . Chronic constipation 05/22/2015  . Vitiligo 05/22/2015    Past Surgical History:  Procedure Laterality Date  . [place]  . COLONOSCOPY WITH PROPOFOL N/A 11/09/2015   Procedure: COLONOSCOPY WITH PROPOFOL;  Surgeon: Josefine Class, MD;  Location: [facility] ENDOSCOPY;  Service: Endoscopy;  Laterality: N/A;  . SUPRACERVICAL ABDOMINAL HYSTERECTOMY     without BSO fibroid tumors    Family History  Problem Relation Age of Onset  . Heart attack Mother     Social History   Social History  . Marital status: Married    Spouse name: N/A  . Number of children: N/A  . Years of education: N/A   Occupational History  . Not on file.   Social History Main Topics  . Smoking status: Former Smoker    Packs/day: 0.50    Years: 10.00    Types: Cigarettes    Start date: 09/15/1969    Quit date: 05/21/1980  . Smokeless tobacco: Never Used  . Alcohol use No  . Drug use: No  . Sexual activity: Not Currently  Other Topics Concern  . Not on file   Social History Narrative  . No narrative on file     Current Outpatient Prescriptions:  .  albuterol (PROVENTIL HFA;VENTOLIN HFA) 108 (90 BASE) MCG/ACT inhaler, Inhale 2 puffs into the lungs every 6 (six) hours as needed for wheezing or shortness of breath., Disp: 1 Inhaler, Rfl: 0 .  amLODipine-atorvastatin (CADUET) 10-40 MG tablet, Take 1 tablet by mouth daily., Disp: 90 tablet, Rfl: 1 .  aspirin 81 MG tablet, Take 81 mg by mouth daily., Disp: , Rfl:  .  Ca Carbonate-Mag Hydroxide 550-110 MG CHEW, Chew by mouth., Disp: , Rfl:  .  diclofenac sodium (VOLTAREN) 1 % GEL, Apply topically as needed. , Disp: , Rfl:  .   Fluticasone-Salmeterol (ADVAIR DISKUS) 250-50 MCG/DOSE AEPB, Inhale into the lungs., Disp: , Rfl:  .  linagliptin (TRADJENTA) 5 MG TABS tablet, Take 1 tablet (5 mg total) by mouth daily., Disp: 30 tablet, Rfl: 2 .  montelukast (SINGULAIR) 10 MG tablet, Take by mouth., Disp: , Rfl:  .  POLYETHYLENE GLYCOL 3350 PO, Take by mouth as needed. Mix with 8oz fluid., Disp: , Rfl:  .  valsartan-hydrochlorothiazide (DIOVAN-HCT) 320-25 MG tablet, Take 1 tablet by mouth daily., Disp: 90 tablet, Rfl: 1 .  Vitamin D, Ergocalciferol, (DRISDOL) 50000 units CAPS capsule, TAKE 1 CAPSULE EVERY 7 DAYS, Disp: 12 capsule, Rfl: 0  Allergies  Allergen Reactions  . Ace Inhibitors      ROS  Constitutional: Negative for fever or weight change.  Respiratory: Negative for cough and shortness of breath.   Cardiovascular: Negative for chest pain or palpitations.  Gastrointestinal: Negative for abdominal pain, no bowel changes.  Musculoskeletal: Negative for gait problem or joint swelling.  Skin: Negative for rash.  Neurological: Negative for dizziness or headache.  No other specific complaints in a complete review of systems (except as listed in HPI above).  Objective  Vitals:   05/26/16 0855  BP: 134/82  Pulse: 83  Resp: 16  Temp: 97.8 F (36.6 C)  TempSrc: Oral  SpO2: 97%  Weight: 265 lb 8 oz (120.4 kg)  Height: 5\' 4"  (1.626 m)    Body mass index is 45.57 kg/m.  Physical Exam  Constitutional: Patient appears well-developed and well-nourished. Obese No distress.  HEENT: head atraumatic, normocephalic, pupils equal and reactive to light,  neck supple, throat within normal limits Cardiovascular: Normal rate, regular rhythm and normal heart sounds.  No murmur heard. Trace  BLE edema. Pulmonary/Chest: Effort normal and breath sounds normal. No respiratory distress. Abdominal: Soft.  There is no tenderness. Psychiatric: Patient has a normal mood and affect. behavior is normal. Judgment and thought  content normal.  Recent Results (from the past 2160 hour(s))  POCT HgB A1C     Status: Normal   Collection Time: 05/26/16  9:04 AM  Result Value Ref Range   Hemoglobin A1C 8.0      PHQ2/9: Depression screen [facility] 2/9 03/31/2016 01/18/2016 11/22/2015 08/15/2015 05/22/2015  Decreased Interest 0 0 0 0 0  Down, Depressed, Hopeless 0 0 0 0 0  PHQ - 2 Score 0 0 0 0 0     Fall Risk: Fall Risk  03/31/2016 01/18/2016 11/22/2015 08/15/2015 05/22/2015  Falls in the past year? No No No No No       Assessment & Plan  1. Type 2 diabetes mellitus with diabetic neuropathy, without long-term current use of insulin (HCC)  hgbA1C is not at goal, however patient has changed her diet  and explained it may reflected on her next level.  - POCT HgB A1C - linagliptin (TRADJENTA) 5 MG TABS tablet; Take 1 tablet (5 mg total) by mouth daily.  Dispense: 30 tablet; Refill: 2  2. Hypertension, benign  At goal  - valsartan-hydrochlorothiazide (DIOVAN-HCT) 320-25 MG tablet; Take 1 tablet by mouth daily.  Dispense: 90 tablet; Refill: 1 - amLODipine-atorvastatin (CADUET) 10-40 MG tablet; Take 1 tablet by mouth daily.  Dispense: 90 tablet; Refill: 1  3. Hyperlipidemia  - valsartan-hydrochlorothiazide (DIOVAN-HCT) 320-25 MG tablet; Take 1 tablet by mouth daily.  Dispense: 90 tablet; Refill: 1 - amLODipine-atorvastatin (CADUET) 10-40 MG tablet; Take 1 tablet by mouth daily.  Dispense: 90 tablet; Refill: 1  4. Mild intermittent asthma, uncomplicated  No recent episodes, usually just flares when she has a cold  5. Obesity, Class III, BMI 40-49.9 (morbid obesity) ([place])  She has just changed her diet and has lost 6.5 lbs since last visit, continue the hard work   6. OSA (obstructive sleep apnea)  She still has not gone for titration study   7. Microalbuminuria due to type 2 diabetes mellitus (HCC)  - valsartan-hydrochlorothiazide (DIOVAN-HCT) 320-25 MG tablet; Take 1 tablet by mouth daily.  Dispense: 90 tablet;  Refill: 1 - linagliptin (TRADJENTA) 5 MG TABS tablet; Take 1 tablet (5 mg total) by mouth daily.  Dispense: 30 tablet; Refill: 2  8. Right Achilles tendinitis  Seen by Dr. Jacqualyn Posey and had steroid injection and wearing a boot at night, still unable to exercise, discussed water activities

## 2016-06-05 ENCOUNTER — Ambulatory Visit: Payer: BLUE CROSS/BLUE SHIELD | Admitting: Podiatry

## 2016-06-07 IMAGING — MG MM DIGITAL SCREENING BILAT W/ CAD
8 series · 8 of 8 positions shown · non-contrast
Comparison: Previous exam(s).

ACR Breast Density Category a: The breast tissue is almost entirely
fatty.

CLINICAL DATA: Screening.

EXAM:
DIGITAL SCREENING BILATERAL MAMMOGRAM WITH CAD

[L MLO (1 of 2)]
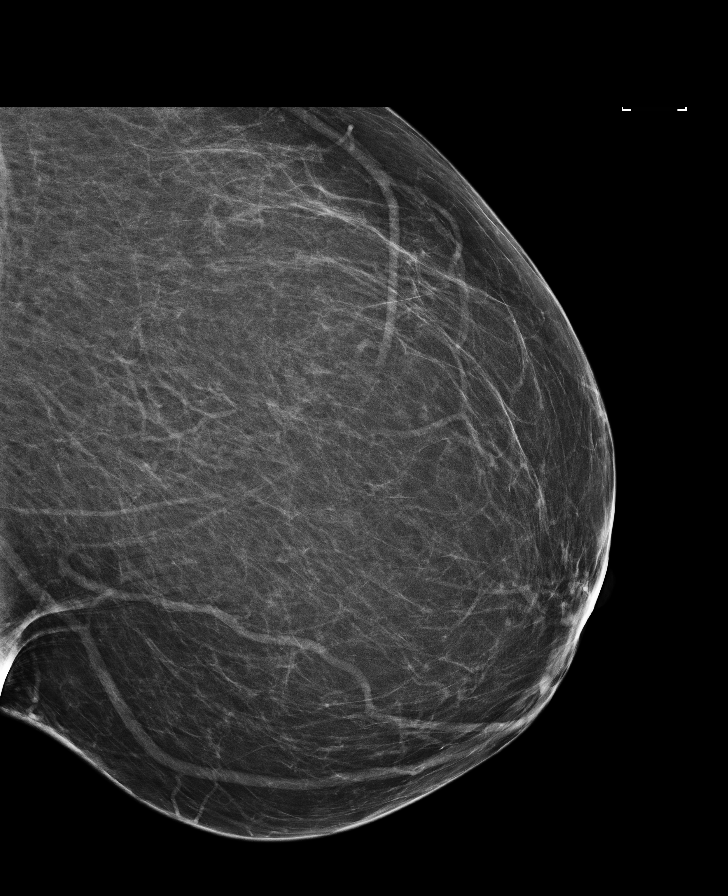

[R CC (1 of 2)]
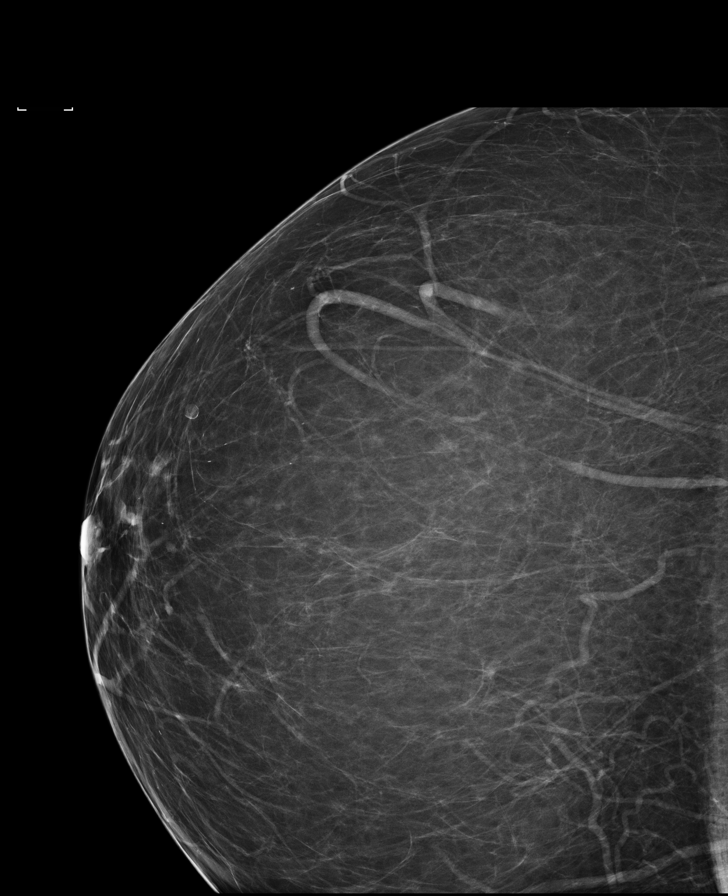

[L MLO (2 of 2)]
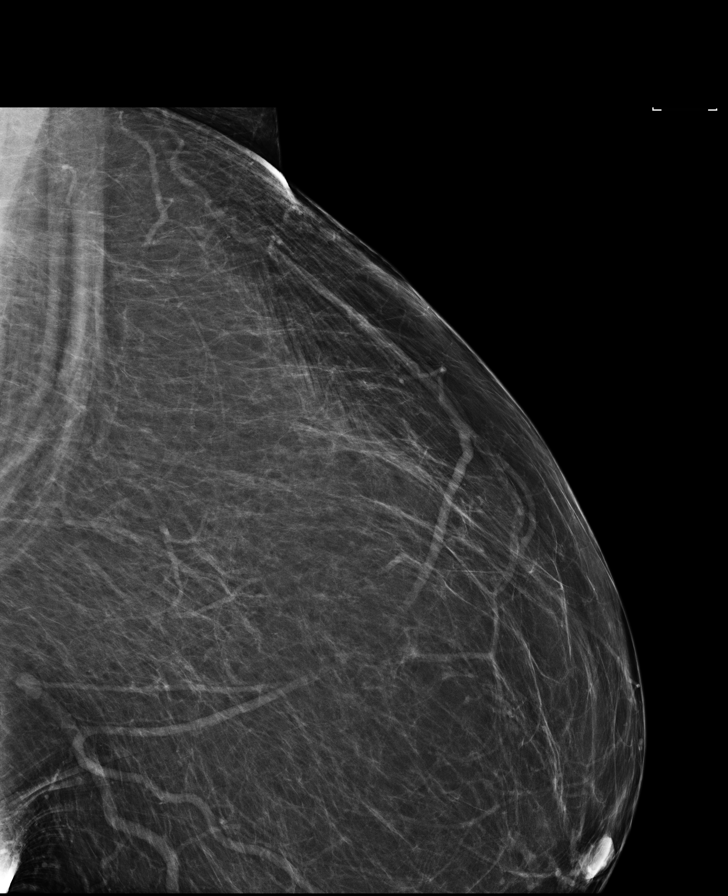

[R MLO (1 of 2)]
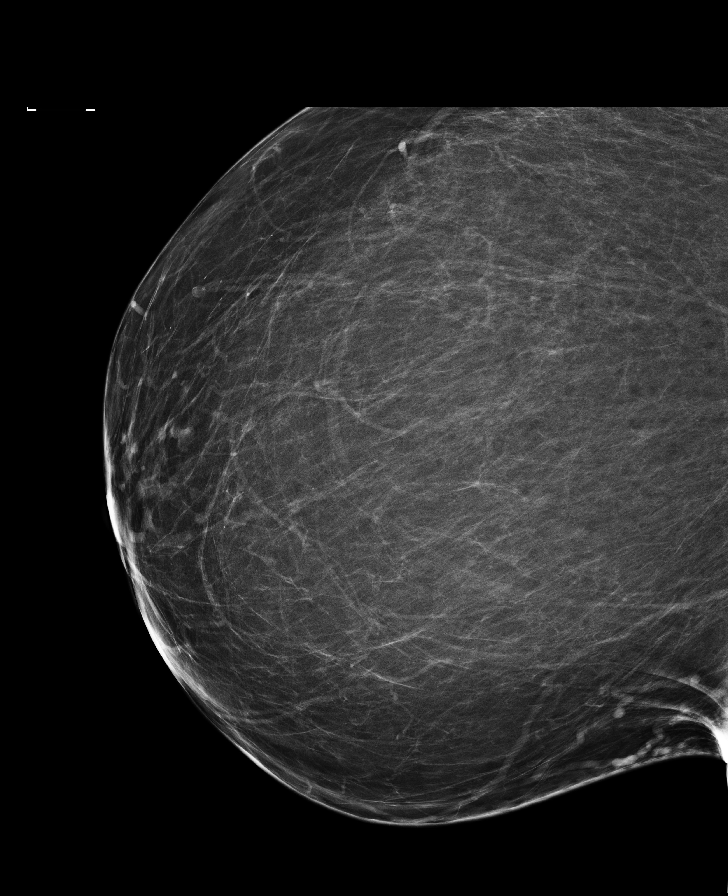

[R MLO (2 of 2)]
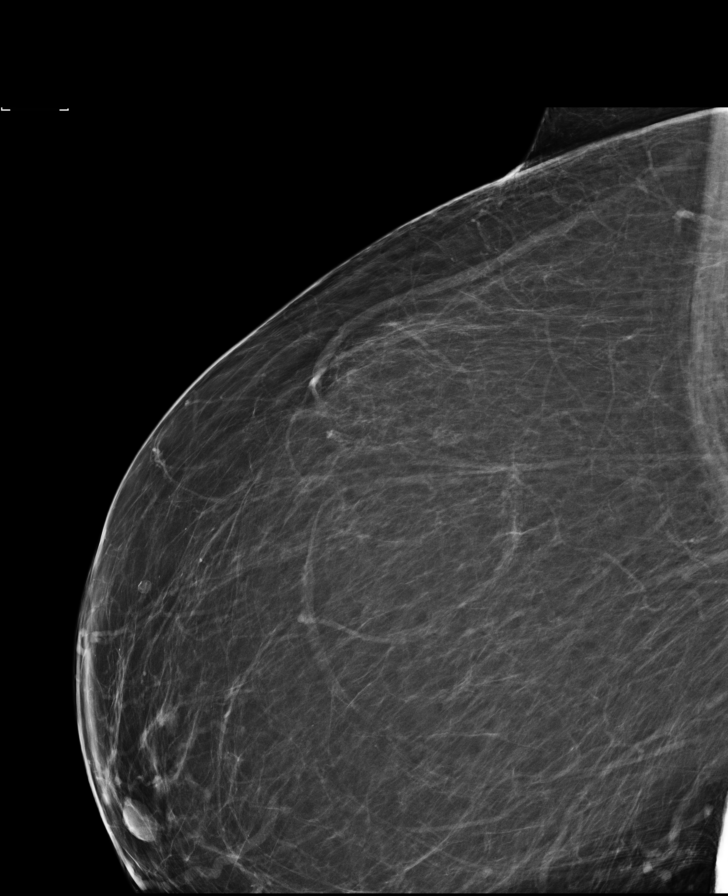

[R CC (2 of 2)]
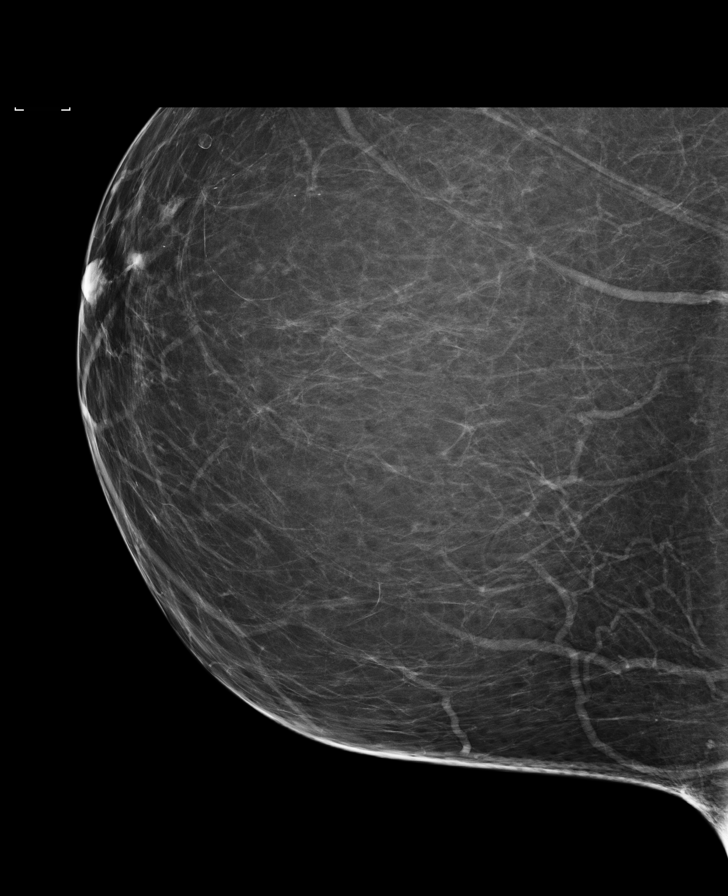

[L CC (1 of 2)]
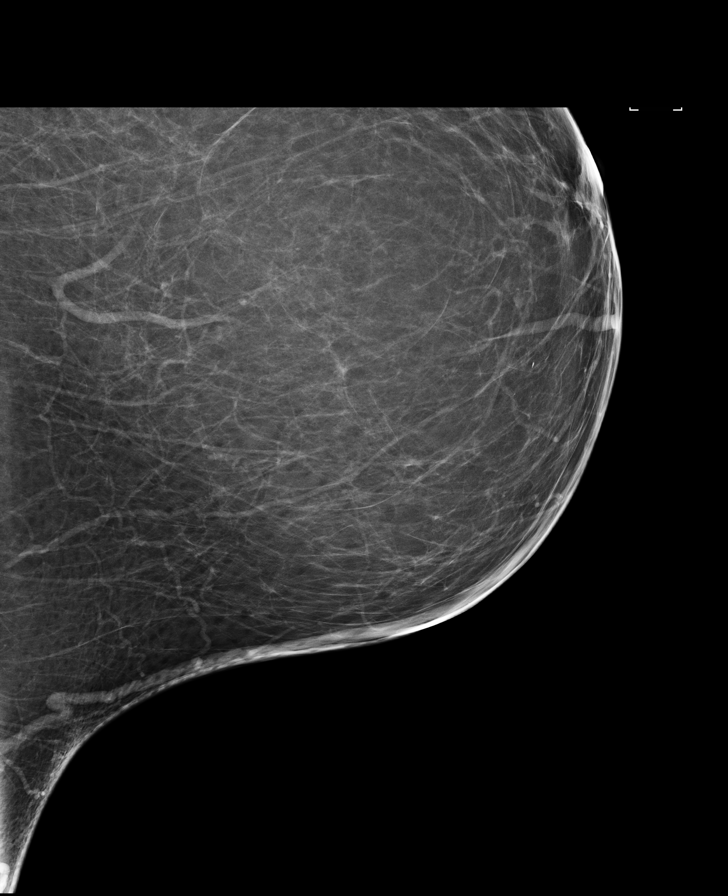

[L CC (2 of 2)]
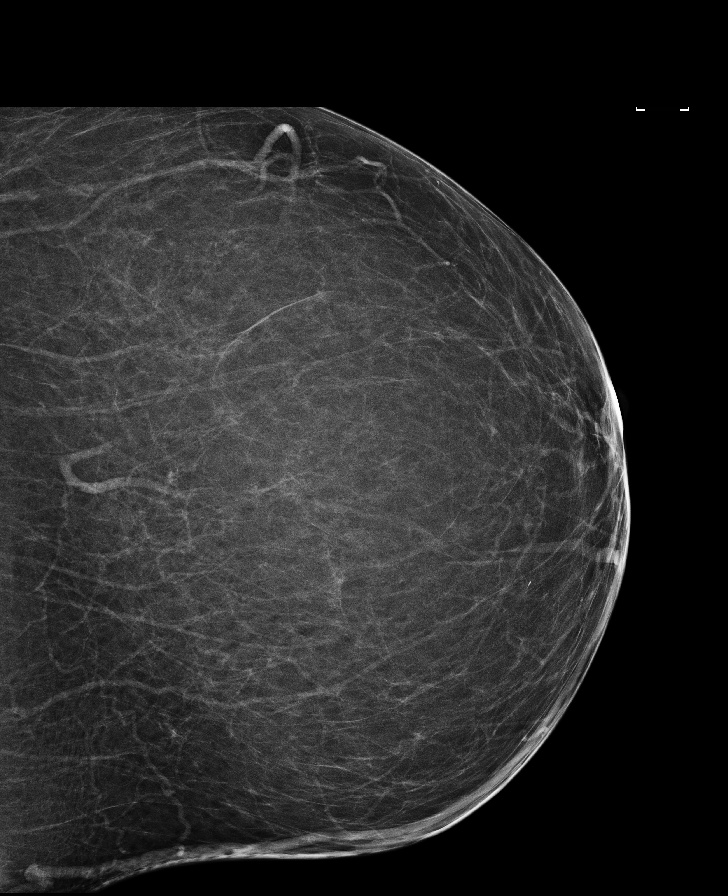

[8 of 8 positions shown; findings below may reference images not displayed]

FINDINGS: There are no findings suspicious for malignancy. Images were
processed with CAD.
IMPRESSION: No mammographic evidence of malignancy. A result letter of this
screening mammogram will be mailed directly to the patient.

RECOMMENDATION:
Screening mammogram in one year. (Code:[id number])

BI-RADS CATEGORY  1: Negative.

## 2016-07-09 ENCOUNTER — Other Ambulatory Visit: Payer: Self-pay

## 2016-07-09 DIAGNOSIS — R809 Proteinuria, unspecified: Secondary | ICD-10-CM

## 2016-07-09 DIAGNOSIS — E1140 Type 2 diabetes mellitus with diabetic neuropathy, unspecified: Secondary | ICD-10-CM

## 2016-07-09 DIAGNOSIS — E1129 Type 2 diabetes mellitus with other diabetic kidney complication: Secondary | ICD-10-CM

## 2016-07-09 MED ORDER — LINAGLIPTIN 5 MG PO TABS: 5.0000 mg | ORAL_TABLET | Freq: Every day | ORAL | 4 refills | Status: AC

## 2016-07-09 NOTE — Telephone Encounter (Signed)
Patient requesting refill of Tradjenta for 90 days to [facility]. It was costing her $10 at the local pharmacy, but now her Insurance will no longer cover local pharmacy. Can you please send to [facility] for a 90 day supply.

## 2016-07-29 ENCOUNTER — Ambulatory Visit
Admission: RE | Admit: 2016-07-29 | Discharge: 2016-07-29 | Disposition: A | Discharge status: Home / Self Care | Payer: BLUE CROSS/BLUE SHIELD | Source: Ambulatory Visit | Attending: Family Medicine | Admitting: Family Medicine

## 2016-07-29 DIAGNOSIS — Z1239 Encounter for other screening for malignant neoplasm of breast: Secondary | ICD-10-CM

## 2016-07-29 DIAGNOSIS — Z1231 Encounter for screening mammogram for malignant neoplasm of breast: Secondary | ICD-10-CM | POA: Insufficient documentation

## 2016-08-17 ENCOUNTER — Other Ambulatory Visit: Payer: Self-pay | Admitting: Family Medicine

## 2016-08-25 ENCOUNTER — Ambulatory Visit (INDEPENDENT_AMBULATORY_CARE_PROVIDER_SITE_OTHER): Payer: BLUE CROSS/BLUE SHIELD | Admitting: Family Medicine

## 2016-08-25 ENCOUNTER — Encounter: Payer: Self-pay | Admitting: Family Medicine

## 2016-08-25 VITALS — BP 118/62 | HR 96 | Temp 98.2°F | Resp 18 | Ht 63.25 in | Wt 252.8 lb

## 2016-08-25 DIAGNOSIS — J4521 Mild intermittent asthma with (acute) exacerbation: Secondary | ICD-10-CM

## 2016-08-25 DIAGNOSIS — R809 Proteinuria, unspecified: Secondary | ICD-10-CM

## 2016-08-25 DIAGNOSIS — G4733 Obstructive sleep apnea (adult) (pediatric): Secondary | ICD-10-CM

## 2016-08-25 DIAGNOSIS — E1129 Type 2 diabetes mellitus with other diabetic kidney complication: Secondary | ICD-10-CM

## 2016-08-25 DIAGNOSIS — E782 Mixed hyperlipidemia: Secondary | ICD-10-CM | POA: Diagnosis not present

## 2016-08-25 DIAGNOSIS — I10 Essential (primary) hypertension: Secondary | ICD-10-CM | POA: Diagnosis not present

## 2016-08-25 DIAGNOSIS — E1140 Type 2 diabetes mellitus with diabetic neuropathy, unspecified: Secondary | ICD-10-CM

## 2016-08-25 LAB — POCT GLYCOSYLATED HEMOGLOBIN (HGB A1C): Hemoglobin A1C: 6.4

## 2016-08-25 MED ORDER — FLUTICASONE FUROATE-VILANTEROL 100-25 MCG/INH IN AEPB: 1.0000 | INHALATION_SPRAY | Freq: Every day | RESPIRATORY_TRACT | 0 refills | Status: AC

## 2016-08-25 MED ORDER — AMLODIPINE-ATORVASTATIN 5-40 MG PO TABS: 1.0000 | ORAL_TABLET | Freq: Every day | ORAL | 1 refills | Status: AC

## 2016-08-25 MED ORDER — VALSARTAN-HYDROCHLOROTHIAZIDE 320-25 MG PO TABS: 0.5000 | ORAL_TABLET | Freq: Every day | ORAL | 0 refills | Status: DC

## 2016-08-25 NOTE — Progress Notes (Signed)
Name: Jennifer Castro   MRN: [id number]    DOB: 09-Sep-1956   Date:08/25/2016       Progress Note  Subjective  Chief Complaint  Chief Complaint  Patient presents with  . Hypertension  . Diabetes  . Hyperlipidemia  . URI    patient stated that she started with sx on Friday after taking a walk outside. she presents with chest tightness, head congestion, dry cough, clear drainage, fatigue, ear and facial pressure. patient has only pushed fluids and resting.    HPI  HTN: she has been back on Caduet and Valsartan/HCTZ daily. BP is lower than normal for her, she has lost weight since last visit and has noticed some orthostatic changes when she gets up quickly. Discussed going down on Caduet to 5/40, but she just filled a 90 day supply so we will cut dose of Valsartan/HcTZ in half until we can decrease dose of Caduet  DM: she has neuropathy and also proteinuria. On ARB, paresthesia on her right foot has been stable and not on medication for that. She was tired of side effects of Metformin - can't take when she travels because of diarrhea. We changed to [place] of 2017. No polyphagia, polydipsia or polyuria. HgbA1C has gone up from 7.1% to 8.4% , 8.0% on her last visit and now down to 6.4% . Average glucose at home for the past month has been down to 120's. She has lost weight by changing her diet and exercising more.   Hyperlipidemia: she had labs done at work and LDL is down from 101 to 70, she is on Caduet and denies side effects of medication  Asthma mild intermittent with acute exacerbation: she developed cold symptoms with rhinorrhea, nasal congestion, a dry cough and mild intermittent SOB, no wheezing. She is feeling tired, no fever or chills. She has not been taking otc medication    Patient Active Problem List   Diagnosis Date Noted  . Retrocalcaneal exostosis 02/22/2016  . Right Achilles tendinitis 02/22/2016  . Microalbuminuria due to type 2 diabetes mellitus ([place]) 08/22/2015   . Perennial allergic rhinitis 08/15/2015  . Hypertension, benign 05/22/2015  . Hyperlipidemia 05/22/2015  . GERD (gastroesophageal reflux disease) 05/22/2015  . Diabetes mellitus with neuropathy ([place]) 05/22/2015  . Mild intermittent asthma 05/22/2015  . OSA (obstructive sleep apnea) 05/22/2015  . Obesity, Class III, BMI 40-49.9 (morbid obesity) ([place]) 05/22/2015  . Vitamin D deficiency 05/22/2015  . Chronic constipation 05/22/2015  . Vitiligo 05/22/2015    Past Surgical History:  Procedure Laterality Date  . [place]  . COLONOSCOPY WITH PROPOFOL N/A 11/09/2015   Procedure: COLONOSCOPY WITH PROPOFOL;  Surgeon: Josefine Class, MD;  Location[facility] ENDOSCOPY;  Service: Endoscopy;  Laterality: N/A;  . SUPRACERVICAL ABDOMINAL HYSTERECTOMY     without BSO fibroid tumors    Family History  Problem Relation Age of Onset  . Heart attack Mother     Social History   Social History  . Marital status: Married    Spouse name: N/A  . Number of children: N/A  . Years of education: N/A   Occupational History  . Not on file.   Social History Main Topics  . Smoking status: Former Smoker    Packs/day: 0.50    Years: 10.00    Types: Cigarettes    Start date: 09/15/1969    Quit date: 05/21/1980  . Smokeless tobacco: Never Used  . Alcohol use No  . Drug use: No  .  Sexual activity: Not Currently   Other Topics Concern  . Not on file   Social History Narrative  . No narrative on file     Current Outpatient Prescriptions:  .  albuterol (PROVENTIL HFA;VENTOLIN HFA) 108 (90 BASE) MCG/ACT inhaler, Inhale 2 puffs into the lungs every 6 (six) hours as needed for wheezing or shortness of breath., Disp: 1 Inhaler, Rfl: 0 .  aspirin 81 MG tablet, Take 81 mg by mouth daily., Disp: , Rfl:  .  Ca Carbonate-Mag Hydroxide 550-110 MG CHEW, Chew by mouth., Disp: , Rfl:  .  diclofenac sodium (VOLTAREN) 1 % GEL, Apply topically as needed. , Disp: , Rfl:  .   linagliptin (TRADJENTA) 5 MG TABS tablet, Take 1 tablet (5 mg total) by mouth daily., Disp: 90 tablet, Rfl: 4 .  montelukast (SINGULAIR) 10 MG tablet, Take by mouth., Disp: , Rfl:  .  POLYETHYLENE GLYCOL 3350 PO, Take by mouth as needed. Mix with 8oz fluid., Disp: , Rfl:  .  valsartan-hydrochlorothiazide (DIOVAN-HCT) 320-25 MG tablet, Take 1 tablet by mouth daily., Disp: 90 tablet, Rfl: 1 .  Vitamin D, Ergocalciferol, (DRISDOL) 50000 units CAPS capsule, TAKE 1 CAPSULE EVERY 7 DAYS, Disp: 12 capsule, Rfl: 0  Allergies  Allergen Reactions  . Ace Inhibitors      ROS  Constitutional: Negative for fever, positive for weight change.  Respiratory: positive for cough but and mild shortness of breath.   Cardiovascular: Negative for chest pain or palpitations.  Gastrointestinal: Negative for abdominal pain, no bowel changes.  Musculoskeletal: Negative for gait problem or joint swelling.  Skin: Negative for rash.  Neurological: Negative for dizziness or headache.  No other specific complaints in a complete review of systems (except as listed in HPI above).  Objective  Vitals:   08/25/16 0759  Pulse: 96  Resp: 18  Temp: 98.2 F (36.8 C)  TempSrc: Oral  SpO2: 98%  Weight: 252 lb 12.8 oz (114.7 kg)  Height: 5' 3.25" (1.607 m)    Body mass index is 44.43 kg/m.  Physical Exam  Constitutional: Patient appears well-developed and well-nourished. Obese  No distress.  HEENT: head atraumatic, normocephalic, pupils equal and reactive to light,  neck supple, throat within normal limits Cardiovascular: Normal rate, regular rhythm and normal heart sounds.  No murmur heard. No BLE edema. Pulmonary/Chest: Effort normal and breath sounds normal. No respiratory distress. Abdominal: Soft.  There is no tenderness. Psychiatric: Patient has a normal mood and affect. behavior is normal. Judgment and thought content normal.   Recent Results (from the past 2160 hour(s))  POCT glycosylated hemoglobin  (Hb A1C)     Status: Normal   Collection Time: 08/25/16  8:16 AM  Result Value Ref Range   Hemoglobin A1C 6.4       PHQ2/9: Depression screen [facility] 2/9 03/31/2016 01/18/2016 11/22/2015 08/15/2015 05/22/2015  Decreased Interest 0 0 0 0 0  Down, Depressed, Hopeless 0 0 0 0 0  PHQ - 2 Score 0 0 0 0 0     Fall Risk: Fall Risk  03/31/2016 01/18/2016 11/22/2015 08/15/2015 05/22/2015  Falls in the past year? No No No No No     Assessment & Plan  1. Type 2 diabetes mellitus with diabetic neuropathy, without long-term current use of insulin (HCC)  She is doing great, lost weight, eating better and exercising, and hgbA1C is at goal  - POCT glycosylated hemoglobin (Hb A1C)  2. Hypertension, benign  - valsartan-hydrochlorothiazide (DIOVAN-HCT) 320-25 MG tablet; Take 0.5 tablets by  mouth daily.  Dispense: 90 tablet; Refill: 0 - amLODipine-atorvastatin (CADUET) 5-40 MG tablet; Take 1 tablet by mouth daily.  Dispense: 90 tablet; Refill: 1  3. Mixed hyperlipidemia  - amLODipine-atorvastatin (CADUET) 5-40 MG tablet; Take 1 tablet by mouth daily.  Dispense: 90 tablet; Refill: 1  4. Mild intermittent asthma, uncomplicated  Doing well, currently has an URI, mostly nasal congestion, advised otc medication, she is having mild cough and SOB, discussed resuming an inhaler- we will try Breo - fluticasone furoate-vilanterol (BREO ELLIPTA) 100-25 MCG/INH AEPB; Inhale 1 puff into the lungs daily.  Dispense: 60 each; Refill: 0  5. OSA (obstructive sleep apnea)  - Ambulatory referral to Sleep Studies   Not using CPAP - she did not go for the second study, she will have to repeat the study   6. Microalbuminuria due to type 2 diabetes mellitus ([place])  We will cut in half until we can change prescription of Caduet ( she just filled a 90 day supply ) - valsartan-hydrochlorothiazide (DIOVAN-HCT) 320-25 MG tablet; Take 0.5 tablets by mouth daily.  Dispense: 90 tablet; Refill: 0

## 2016-08-25 NOTE — Patient Instructions (Signed)
Once you finished Caduet 10/40 we will switch to Caduet 5/40 For now: take half dose of Diovan/HCTZ 320/25 and once we go down on Caduet dose you can resume full pill daily

## 2016-11-04 ENCOUNTER — Other Ambulatory Visit: Payer: Self-pay | Admitting: Family Medicine

## 2016-11-04 DIAGNOSIS — I10 Essential (primary) hypertension: Secondary | ICD-10-CM

## 2016-11-04 DIAGNOSIS — R809 Proteinuria, unspecified: Secondary | ICD-10-CM

## 2016-11-04 DIAGNOSIS — E785 Hyperlipidemia, unspecified: Secondary | ICD-10-CM

## 2016-11-04 DIAGNOSIS — E1129 Type 2 diabetes mellitus with other diabetic kidney complication: Secondary | ICD-10-CM

## 2016-11-09 ENCOUNTER — Other Ambulatory Visit: Payer: Self-pay | Admitting: Family Medicine

## 2016-11-24 ENCOUNTER — Ambulatory Visit: Payer: BLUE CROSS/BLUE SHIELD | Admitting: Family Medicine

## 2016-12-09 ENCOUNTER — Encounter: Payer: Self-pay | Admitting: Family Medicine

## 2016-12-09 ENCOUNTER — Ambulatory Visit (INDEPENDENT_AMBULATORY_CARE_PROVIDER_SITE_OTHER): Payer: BLUE CROSS/BLUE SHIELD | Admitting: Family Medicine

## 2016-12-09 VITALS — BP 126/74 | HR 66 | Temp 97.6°F | Resp 16 | Ht 63.0 in | Wt 248.6 lb

## 2016-12-09 DIAGNOSIS — J4520 Mild intermittent asthma, uncomplicated: Secondary | ICD-10-CM

## 2016-12-09 DIAGNOSIS — M5416 Radiculopathy, lumbar region: Secondary | ICD-10-CM

## 2016-12-09 DIAGNOSIS — R9431 Abnormal electrocardiogram [ECG] [EKG]: Secondary | ICD-10-CM

## 2016-12-09 DIAGNOSIS — E1129 Type 2 diabetes mellitus with other diabetic kidney complication: Secondary | ICD-10-CM | POA: Diagnosis not present

## 2016-12-09 DIAGNOSIS — K219 Gastro-esophageal reflux disease without esophagitis: Secondary | ICD-10-CM

## 2016-12-09 DIAGNOSIS — R5383 Other fatigue: Secondary | ICD-10-CM

## 2016-12-09 DIAGNOSIS — E782 Mixed hyperlipidemia: Secondary | ICD-10-CM | POA: Diagnosis not present

## 2016-12-09 DIAGNOSIS — I10 Essential (primary) hypertension: Secondary | ICD-10-CM

## 2016-12-09 DIAGNOSIS — R809 Proteinuria, unspecified: Secondary | ICD-10-CM

## 2016-12-09 DIAGNOSIS — G4733 Obstructive sleep apnea (adult) (pediatric): Secondary | ICD-10-CM

## 2016-12-09 DIAGNOSIS — G8929 Other chronic pain: Secondary | ICD-10-CM | POA: Insufficient documentation

## 2016-12-09 DIAGNOSIS — R0789 Other chest pain: Secondary | ICD-10-CM

## 2016-12-09 LAB — POCT GLYCOSYLATED HEMOGLOBIN (HGB A1C): HEMOGLOBIN A1C: 6.2

## 2016-12-09 MED ORDER — VALSARTAN-HYDROCHLOROTHIAZIDE 320-25 MG PO TABS: 0.5000 | ORAL_TABLET | Freq: Every day | ORAL | 1 refills | Status: AC

## 2016-12-09 MED ORDER — TIZANIDINE HCL 2 MG PO CAPS: 2.0000 mg | ORAL_CAPSULE | Freq: Three times a day (TID) | ORAL | 0 refills | Status: AC | PRN

## 2016-12-09 MED ORDER — OMEPRAZOLE 20 MG PO CPDR: 20.0000 mg | DELAYED_RELEASE_CAPSULE | Freq: Every day | ORAL | 0 refills | Status: AC

## 2016-12-09 NOTE — Progress Notes (Signed)
Name: Jennifer Castro   MRN: [id number]    DOB: 05/12/1956   Date:12/09/2016       Progress Note  Subjective  Chief Complaint  Chief Complaint  Patient presents with  . Medication Refill    3 month F/U  . Hyperlipidemia    Muscle cramps  . Hypertension    Headaches and dizziness  . Diabetes    Checks twice daily, Lowest 87 Average-97 Highest 135, Has been very fatigue  . Back Pain    Lower back has been bothering her since exercising-last week her pain has been steady increasing.    HPI  HTN: she has been back on Caduet and Valsartan/HCTZ daily. BP is at goal now, she has been taking half dose of Caduet, we will recheck labs today,  she has lost weight since last visit, but denies side effects of medication.   DM: she has neuropathy and also proteinuria. On ARB, paresthesia on her right foot has been stable and not on medication for that. She was tired of side effects of Metformin - can't take when she travels because of diarrhea. We changed to [place] of 2017. No polyphagia, polydipsia or polyuria. HgbA1C has gone up from 7.1% to 8.4% , 8.0% down to 6.4% and today is 6.2% . Average glucose at home for the past month has been 88-135. She has lost weight by changing her diet and exercising more.   Hyperlipidemia: she had labs done at work and LDL is down from 101 to 70, she is on Caduet and denies side effects of medication, currently on a half pill, we will recheck level  Asthma mild intermittent with acute exacerbation: doing well at this time, no wheezing or SOB  Fatigue: she had the flu back in January but recovered, however continues to feel tired, she wakes up feeling well, but feels tired all day, she needs to have sleep study done. She states she has noticed some substernal chest discomfort and with the fatigue she is very worried about heart disease. She states pain is more like a hunger like pain, not associated with nausea, vomiting, SOB or diaphoresis, however she is  high risk for heart disease. He was seen by Dr. Cathie Olden - cardiologist back in 2015 negative work up  Low back pain: she states pain has been continues, mild , dull, aching like, takes Tylenol , seen by neurosurgeon, she states sometimes radiates to the back of her legs.    Patient Active Problem List   Diagnosis Date Noted  . Retrocalcaneal exostosis 02/22/2016  . Right Achilles tendinitis 02/22/2016  . Microalbuminuria due to type 2 diabetes mellitus ([place]) 08/22/2015  . Perennial allergic rhinitis 08/15/2015  . Hypertension, benign 05/22/2015  . Hyperlipidemia 05/22/2015  . GERD (gastroesophageal reflux disease) 05/22/2015  . Diabetes mellitus with neuropathy ([place]) 05/22/2015  . Mild intermittent asthma 05/22/2015  . OSA (obstructive sleep apnea) 05/22/2015  . Obesity, Class III, BMI 40-49.9 (morbid obesity) ([place]) 05/22/2015  . Vitamin D deficiency 05/22/2015  . Chronic constipation 05/22/2015  . Vitiligo 05/22/2015    Past Surgical History:  Procedure Laterality Date  . [place]  . COLONOSCOPY WITH PROPOFOL N/A 11/09/2015   Procedure: COLONOSCOPY WITH PROPOFOL;  Surgeon: Josefine Class, MD;  Location: [facility] ENDOSCOPY;  Service: Endoscopy;  Laterality: N/A;  . SUPRACERVICAL ABDOMINAL HYSTERECTOMY     without BSO fibroid tumors    Family History  Problem Relation Age of Onset  . Heart attack  Mother     Social History   Social History  . Marital status: Married    Spouse name: N/A  . Number of children: N/A  . Years of education: N/A   Occupational History  . Not on file.   Social History Main Topics  . Smoking status: Former Smoker    Packs/day: 0.50    Years: 10.00    Types: Cigarettes    Start date: 09/15/1969    Quit date: 05/21/1980  . Smokeless tobacco: Never Used  . Alcohol use No  . Drug use: No  . Sexual activity: Not Currently   Other Topics Concern  . Not on file   Social History Narrative  . No narrative  on file     Current Outpatient Prescriptions:  .  albuterol (PROVENTIL HFA;VENTOLIN HFA) 108 (90 BASE) MCG/ACT inhaler, Inhale 2 puffs into the lungs every 6 (six) hours as needed for wheezing or shortness of breath., Disp: 1 Inhaler, Rfl: 0 .  amLODipine-atorvastatin (CADUET) 5-40 MG tablet, Take 1 tablet by mouth daily., Disp: 90 tablet, Rfl: 1 .  aspirin 81 MG tablet, Take 81 mg by mouth daily., Disp: , Rfl:  .  Ca Carbonate-Mag Hydroxide 550-110 MG CHEW, Chew by mouth., Disp: , Rfl:  .  fluticasone furoate-vilanterol (BREO ELLIPTA) 100-25 MCG/INH AEPB, Inhale 1 puff into the lungs daily., Disp: 60 each, Rfl: 0 .  linagliptin (TRADJENTA) 5 MG TABS tablet, Take 1 tablet (5 mg total) by mouth daily., Disp: 90 tablet, Rfl: 4 .  montelukast (SINGULAIR) 10 MG tablet, Take by mouth., Disp: , Rfl:  .  POLYETHYLENE GLYCOL 3350 PO, Take by mouth as needed. Mix with 8oz fluid., Disp: , Rfl:  .  valsartan-hydrochlorothiazide (DIOVAN-HCT) 320-25 MG tablet, Take 0.5 tablets by mouth daily., Disp: 90 tablet, Rfl: 0 .  Vitamin D, Ergocalciferol, (DRISDOL) 50000 units CAPS capsule, TAKE 1 CAPSULE EVERY 7 DAYS, Disp: 12 capsule, Rfl: 0  Allergies  Allergen Reactions  . Ace Inhibitors      ROS  Constitutional: Negative for fever, positive for mild  weight change.  Respiratory: Negative for cough and shortness of breath.   Cardiovascular: Positive  For intermittent  chest pain no  palpitations.  Gastrointestinal: Negative for abdominal pain, no bowel changes.  Musculoskeletal: Negative for gait problem or joint swelling.  Skin: Negative for rash.  Neurological: Negative for dizziness or headache.  No other specific complaints in a complete review of systems (except as listed in HPI above).  Objective  Vitals:   12/09/16 1450  BP: 126/74  Pulse: 66  Resp: 16  Temp: 97.6 F (36.4 C)  TempSrc: Oral  SpO2: 99%  Weight: 248 lb 9.6 oz (112.8 kg)  Height: 5\' 3"  (1.6 m)    Body mass index is  44.04 kg/m.  Physical Exam  Constitutional: Patient appears well-developed and well-nourished. Obese No distress.  HEENT: head atraumatic, normocephalic, pupils equal and reactive to light,neck supple, throat within normal limits Cardiovascular: Normal rate, regular rhythm and normal heart sounds.  No murmur heard. No BLE edema. Pulmonary/Chest: Effort normal and breath sounds normal. No respiratory distress. Abdominal: Soft.  There is no tenderness. Psychiatric: Patient has a normal mood and affect. behavior is normal. Judgment and thought content normal. Muscular Skeletal: she has intermittent back pain, takes it prn, usually Tylenol helps  Recent Results (from the past 2160 hour(s))  POCT HgB A1C     Status: Normal   Collection Time: 12/09/16  2:58 PM  Result Value  Ref Range   Hemoglobin A1C 6.2      PHQ2/9: Depression screen [facility] 2/9 12/09/2016 03/31/2016 01/18/2016 11/22/2015 08/15/2015  Decreased Interest 0 0 0 0 0  Down, Depressed, Hopeless 0 0 0 0 0  PHQ - 2 Score 0 0 0 0 0     Fall Risk: Fall Risk  12/09/2016 03/31/2016 01/18/2016 11/22/2015 08/15/2015  Falls in the past year? No No No No No     Functional Status Survey: Is the patient deaf or have difficulty hearing?: No Does the patient have difficulty seeing, even when wearing glasses/contacts?: No Does the patient have difficulty concentrating, remembering, or making decisions?: No Does the patient have difficulty walking or climbing stairs?: No Does the patient have difficulty dressing or bathing?: No    Assessment & Plan  1. Microalbuminuria due to type 2 diabetes mellitus (HCC)  - POCT HgB A1C - valsartan-hydrochlorothiazide (DIOVAN-HCT) 320-25 MG tablet; Take 0.5 tablets by mouth daily.  Dispense: 90 tablet; Refill: 1  2. Hypertension, benign  - EKG 12-Lead - COMPLETE METABOLIC PANEL WITH GFR - CBC with Differential/Platelet - valsartan-hydrochlorothiazide (DIOVAN-HCT) 320-25 MG tablet; Take 0.5 tablets by  mouth daily.  Dispense: 90 tablet; Refill: 1  3. Mixed hyperlipidemia  - Lipid panel  4. Mild intermittent asthma, uncomplicated   5. Obesity, Class III, BMI 40-49.9 (morbid obesity) ([place])  Lost some weight since last visit   6. OSA (obstructive sleep apnea)  She changed insurance, waiting to have study repeated  7. Chest discomfort  - Ambulatory referral to Cardiology  8. Abnormal EKG  - Ambulatory referral to Cardiology  9. Fatigue, unspecified type  - Ambulatory referral to Cardiology - Vitamin B12 - VITAMIN D 25 Hydroxy (Vit-D Deficiency, Fractures) - TSH - CBC with Differential/Platelet  10. Gastroesophageal reflux disease without esophagitis  - omeprazole (PRILOSEC) 20 MG capsule; Take 1 capsule (20 mg total) by mouth daily.  Dispense: 90 capsule; Refill: 0  11. Intermittent low back pain  - tizanidine (ZANAFLEX) 2 MG capsule; Take 1 capsule (2 mg total) by mouth 3 (three) times daily as needed for muscle spasms.  Dispense: 30 capsule; Refill: 0

## 2016-12-12 LAB — CBC WITH DIFFERENTIAL/PLATELET
BASOS PCT: 0 %
Basophils Absolute: 0 cells/uL (ref 0–200)
Eosinophils Absolute: 220 cells/uL (ref 15–500)
Eosinophils Relative: 4 %
HCT: 39.4 % (ref 35.0–45.0)
Hemoglobin: 12.5 g/dL (ref 11.7–15.5)
LYMPHS PCT: 29 %
Lymphs Abs: 1595 cells/uL (ref 850–3900)
MCH: 27.3 pg (ref 27.0–33.0)
MCHC: 31.7 g/dL — ABNORMAL LOW (ref 32.0–36.0)
MCV: 86 fL (ref 80.0–100.0)
MONOS PCT: 10 %
MPV: 11.8 fL (ref 7.5–12.5)
Monocytes Absolute: 550 cells/uL (ref 200–950)
Neutro Abs: 3135 cells/uL (ref 1500–7800)
Neutrophils Relative %: 57 %
PLATELETS: 254 10*3/uL (ref 140–400)
RBC: 4.58 MIL/uL (ref 3.80–5.10)
RDW: 16.7 % — AB (ref 11.0–15.0)
WBC: 5.5 10*3/uL (ref 3.8–10.8)

## 2016-12-12 LAB — LIPID PANEL
Cholesterol: 156 mg/dL (ref <200)
HDL: 43 mg/dL — AB (ref >50)
LDL Cholesterol: 104 mg/dL — ABNORMAL HIGH (ref <100)
TRIGLYCERIDES: 47 mg/dL (ref <150)
Total CHOL/HDL Ratio: 3.6 Ratio (ref <5.0)
VLDL: 9 mg/dL (ref <30)

## 2016-12-12 LAB — COMPLETE METABOLIC PANEL WITHOUT GFR
ALT: 14 U/L (ref 6–29)
AST: 15 U/L (ref 10–35)
Albumin: 3.5 g/dL — ABNORMAL LOW (ref 3.6–5.1)
Alkaline Phosphatase: 57 U/L (ref 33–130)
BUN: 14 mg/dL (ref 7–25)
CO2: 26 mmol/L (ref 20–31)
Calcium: 9.3 mg/dL (ref 8.6–10.4)
Chloride: 105 mmol/L (ref 98–110)
Creat: 0.69 mg/dL (ref 0.50–0.99)
GFR, Est African American: >89 mL/min
GFR, Est Non African American: >89 mL/min
Glucose, Bld: 95 mg/dL (ref 65–99)
Potassium: 3.9 mmol/L (ref 3.5–5.3)
Sodium: 139 mmol/L (ref 135–146)
Total Bilirubin: 0.5 mg/dL (ref 0.2–1.2)
Total Protein: 6.7 g/dL (ref 6.1–8.1)

## 2016-12-13 LAB — VITAMIN D 25 HYDROXY (VIT D DEFICIENCY, FRACTURES): Vit D, 25-Hydroxy: 25 ng/mL — ABNORMAL LOW (ref 30–100)

## 2016-12-13 LAB — VITAMIN B12: Vitamin B-12: 397 pg/mL (ref 200–1100)

## 2016-12-13 LAB — TSH: TSH: 1.78 m[IU]/L

## 2016-12-22 ENCOUNTER — Other Ambulatory Visit: Payer: Self-pay | Admitting: Family Medicine

## 2016-12-22 MED ORDER — ATORVASTATIN CALCIUM 40 MG PO TABS: 40.0000 mg | ORAL_TABLET | Freq: Every day | ORAL | 1 refills | Status: DC

## 2017-01-14 ENCOUNTER — Encounter: Payer: Self-pay | Admitting: Internal Medicine

## 2017-01-14 ENCOUNTER — Ambulatory Visit (INDEPENDENT_AMBULATORY_CARE_PROVIDER_SITE_OTHER): Payer: BLUE CROSS/BLUE SHIELD | Admitting: Internal Medicine

## 2017-01-14 VITALS — BP 136/80 | HR 69 | Ht 64.5 in | Wt 246.2 lb

## 2017-01-14 DIAGNOSIS — R0789 Other chest pain: Secondary | ICD-10-CM

## 2017-01-14 NOTE — Patient Instructions (Addendum)
Medication Instructions:  Your physician recommends that you continue on your current medications as directed. Please refer to the Current Medication list given to you today.   Labwork: none  Testing/Procedures: Your physician has requested that you have en exercise stress myoview. For further information please visit [email or web address]. Please follow instruction sheet, as given.    [place]  Your caregiver has ordered a Stress Test with nuclear imaging. The purpose of this test is to evaluate the blood supply to your heart muscle. This procedure is referred to as a "Non-Invasive Stress Test." This is because other than having an IV started in your vein, nothing is inserted or "invades" your body. Cardiac stress tests are done to find areas of poor blood flow to the heart by determining the extent of coronary artery disease (CAD). Some patients exercise on a treadmill, which naturally increases the blood flow to your heart, while others who are  unable to walk on a treadmill due to physical limitations have a pharmacologic/chemical stress agent called Lexiscan . This medicine will mimic walking on a treadmill by temporarily increasing your coronary blood flow.   Please note: these test may take anywhere between 2-4 hours to complete  PLEASE REPORT TO [facility] AT THE FIRST DESK WILL DIRECT YOU WHERE TO GO  Date of Procedure:_______05/07/18_____________  Arrival Time for Procedure:_______07:45 am_____________    Instructions regarding medication:   _X_ : Hold diabetes medication (TRADJENTA) morning of procedure   _X_:  Hold other medications as follows: _____VALSARTAN/HYDROCHLOROTHIAZIDE___    PLEASE NOTIFY THE OFFICE AT LEAST 24 HOURS IN ADVANCE IF YOU ARE UNABLE TO KEEP YOUR APPOINTMENT.  [phone] AND  PLEASE NOTIFY NUCLEAR MEDICINE AT [facility] AT LEAST 24 HOURS IN ADVANCE IF YOU ARE UNABLE TO KEEP YOUR APPOINTMENT. [phone]  How to  prepare for your Myoview test:  1. Do not eat or drink after midnight 2. No caffeine for 24 hours prior to test 3. No smoking 24 hours prior to test. 4. Your medication may be taken with water.  If your doctor stopped a medication because of this test, do not take that medication. 5. Ladies, please do not wear dresses.  Skirts or pants are appropriate. Please wear a short sleeve shirt. 6. No perfume, cologne or lotion. 7. Wear comfortable walking shoes. No heels!   Follow-Up: Your physician recommends that you schedule a follow-up appointment in: [place].

## 2017-01-14 NOTE — Progress Notes (Signed)
New Outpatient Visit Date: 01/14/2017  Referring Provider: Steele Sizer, MD [address] [place], [place] [address]  Chief Complaint: Chest pain  HPI:  Ms. Argabright is a 61 y.o. female who is being seen today for the evaluation of chest pain at the request of Dr. Madelaine Bhat. She has a history of DM2, HTN, hyperlipidemia, asthma, OSA, and obesity. She was seen in 2015 by Dr. Acie Fredrickson after a single episode of atypical chest pain. No further workup was pursued at that time. For the last year, she has experienced occasional episodes of chest pain that she describes as a sharp pain in the center of her chest. It lasts a few seconds and occurs once or twice a month. The most recent event occurred about 1 month ago. There are no associated symptoms or precipitating factors. It is not exertional and is similar to what she has experienced as far back as 1977, at which time she underwent cardiac catheterization in [place]. She experienced intermittent lightheadedness in the past, though this has resolved after the timing of her blood pressure medications was adjusted. She denies palpitations, orthopnea, edema, and claudication.  Ms. Satterly continues to work in [place] but is back to commuting from [place], [place] again. She is considering looking for a new job in [place].  --------------------------------------------------------------------------------------------------  Cardiovascular History & Procedures: Cardiovascular Problems:  Atypical chest pain  Risk Factors:  DM, HTN, hyperlipidemia, obesity, and family history  Cath/PCI:  LHC (1977): Reportedly no significant CAD  CV Surgery:  None  EP Procedures and Devices:  None  Non-Invasive Evaluation(s):  None available  Recent CV Pertinent Labs: Lab Results  Component Value Date   CHOL 156 12/12/2016   CHOL 161 01/18/2016   HDL 43 (L) 12/12/2016   HDL 49 01/18/2016   LDLCALC 104 (H) 12/12/2016   LDLCALC 101 (H)  01/18/2016   TRIG 47 12/12/2016   CHOLHDL 3.6 12/12/2016   BNP 21.7 08/15/2015   K 3.9 12/12/2016   BUN 14 12/12/2016   BUN 12 01/18/2016   CREATININE 0.69 12/12/2016    --------------------------------------------------------------------------------------------------  Past Medical History:  Diagnosis Date  . Asthma   . Carpal tunnel syndrome   . Diabetes mellitus type II   . Disorder of rotator cuff   . Excess or deficiency of vitamin D   . Gastroesophageal reflux disease   . Hemorrhoids   . Hyperlipidemia   . Hypertension   . Obesity   . OSA (obstructive sleep apnea)   . Ovarian failure   . Tendonitis, Achilles   . Vitiligo     Past Surgical History:  Procedure Laterality Date  . [place]  . COLONOSCOPY WITH PROPOFOL N/A 11/09/2015   Procedure: COLONOSCOPY WITH PROPOFOL;  Surgeon: Josefine Class, MD;  Location: [facility] ENDOSCOPY;  Service: Endoscopy;  Laterality: N/A;  . SUPRACERVICAL ABDOMINAL HYSTERECTOMY     without BSO fibroid tumors    Outpatient Encounter Prescriptions as of 01/14/2017  Medication Sig  . albuterol (PROVENTIL HFA;VENTOLIN HFA) 108 (90 BASE) MCG/ACT inhaler Inhale 2 puffs into the lungs every 6 (six) hours as needed for wheezing or shortness of breath.  Mar[place] Kitchen amLODipine-atorvastatin (CADUET) 5-40 MG tablet Take 1 tablet by mouth daily.  Marland Kitchen aspirin 81 MG tablet Take 81 mg by mouth daily.  Marland Kitchen atorvastatin (LIPITOR) 40 MG tablet Take 1 tablet (40 mg total) by mouth daily.  . Ca Carbonate-Mag Hydroxide 550-110 MG CHEW Chew by mouth.  . fluticasone  furoate-vilanterol (BREO ELLIPTA) 100-25 MCG/INH AEPB Inhale 1 puff into the lungs daily.  Marland Kitchen linagliptin (TRADJENTA) 5 MG TABS tablet Take 1 tablet (5 mg total) by mouth daily.  . montelukast (SINGULAIR) 10 MG tablet Take by mouth.  Marland Kitchen omeprazole (PRILOSEC) 20 MG capsule Take 1 capsule (20 mg total) by mouth daily.  Marland Kitchen POLYETHYLENE GLYCOL 3350 PO Take by mouth as needed. Mix  with 8oz fluid.  Marland Kitchen tizanidine (ZANAFLEX) 2 MG capsule Take 1 capsule (2 mg total) by mouth 3 (three) times daily as needed for muscle spasms.  . valsartan-hydrochlorothiazide (DIOVAN-HCT) 320-25 MG tablet Take 0.5 tablets by mouth daily.  . Vitamin D, Ergocalciferol, (DRISDOL) 50000 units CAPS capsule TAKE 1 CAPSULE EVERY 7 DAYS   No facility-administered encounter medications on file as of 01/14/2017.     Allergies: Ace inhibitors  Social History   Social History  . Marital status: Married    Spouse name: N/A  . Number of children: N/A  . Years of education: N/A   Occupational History  . Not on file.   Social History Main Topics  . Smoking status: Former Smoker    Packs/day: 0.50    Years: 10.00    Types: Cigarettes    Start date: 09/15/1969    Quit date: 05/21/1980  . Smokeless tobacco: Never Used  . Alcohol use No  . Drug use: No  . Sexual activity: Not Currently   Other Topics Concern  . Not on file   Social History Narrative  . No narrative on file    Family History  Problem Relation Age of Onset  . Heart attack Mother 23  . Diabetes Mother   . Heart attack Father 34    Review of Systems: A 12-system review of systems was performed and was negative except as noted in the HPI.  --------------------------------------------------------------------------------------------------  Physical Exam: BP 136/80 (BP Location: Right Arm, Patient Position: Sitting, Cuff Size: Normal)   Pulse 69   Ht 5' 4.5" (1.638 m)   Wt 246 lb 4 oz (111.7 kg)   BMI 41.62 kg/m   General:  Morbidly obese woman, seated comfortably in bed. HEENT: No conjunctival pallor or scleral icterus.  Moist mucous membranes.  OP clear. Neck: Supple without lymphadenopathy, thyromegaly, JVD, or HJR.  No carotid bruit. Lungs: Normal work of breathing.  Clear to auscultation bilaterally without wheezes or crackles. Heart: Regular rate and rhythm without murmurs, rubs, or gallops.  Unable to assess PMI  due to body habitus. Abd: Bowel sounds present.  Soft, NT/ND. Unable to assess HSM due to body habitus. Ext: No lower extremity edema.  Radial, PT, and DP pulses are 2+ bilaterally Skin: warm and dry without rash Neuro: CNIII-XII intact.  Strength and fine-touch sensation intact in upper and lower extremities bilaterally. Psych: Normal mood and affect.  EKG:  Normal sinus rhythm without significant abnormalities.  Lab Results  Component Value Date   WBC 5.5 12/12/2016   HGB 12.5 12/12/2016   HCT 39.4 12/12/2016   MCV 86.0 12/12/2016   PLT 254 12/12/2016    Lab Results  Component Value Date   NA 139 12/12/2016   K 3.9 12/12/2016   CL 105 12/12/2016   CO2 26 12/12/2016   BUN 14 12/12/2016   CREATININE 0.69 12/12/2016   GLUCOSE 95 12/12/2016   ALT 14 12/12/2016    Lab Results  Component Value Date   CHOL 156 12/12/2016   HDL 43 (L) 12/12/2016   LDLCALC 104 (H) 12/12/2016  TRIG 47 12/12/2016   CHOLHDL 3.6 12/12/2016    --------------------------------------------------------------------------------------------------  ASSESSMENT AND PLAN: Atypical chest pain Symptoms are atypical and have been present for about a year. She has noted similar pain as far back as 1977, at which time left heart catheterization was reportedly negative. Though my suspicion for obstructive CAD is low based on character of symptoms, Ms. Magistro has multiple cardiac risk factors. We have therefore agreed to obtain an exercise myocardial perfusion stress test. She should continue her current medications.  Follow-up: Return to clinic in 1 months.  Nelva Bush, MD 01/15/2017 8:49 PM

## 2017-01-19 ENCOUNTER — Encounter
Admission: RE | Admit: 2017-01-19 | Discharge: 2017-01-19 | Disposition: A | Discharge status: Home / Self Care | Payer: BLUE CROSS/BLUE SHIELD | Source: Ambulatory Visit | Attending: Internal Medicine | Admitting: Internal Medicine

## 2017-01-19 ENCOUNTER — Other Ambulatory Visit (INDEPENDENT_AMBULATORY_CARE_PROVIDER_SITE_OTHER): Payer: BLUE CROSS/BLUE SHIELD | Admitting: *Deleted

## 2017-01-19 DIAGNOSIS — R0789 Other chest pain: Secondary | ICD-10-CM

## 2017-01-19 DIAGNOSIS — I10 Essential (primary) hypertension: Secondary | ICD-10-CM | POA: Diagnosis not present

## 2017-01-19 LAB — NM MYOCAR MULTI W/SPECT W/WALL MOTION / EF
CHL CUP NUCLEAR SDS: 3
CHL CUP NUCLEAR SRS: 13
CHL CUP NUCLEAR SSS: 13
CSEPHR: 90 %
Estimated workload: 7 METS
Exercise duration (min): 6 min
Exercise duration (sec): 1 s
LVDIAVOL: 65 mL (ref 46–106)
LVSYSVOL: 25 mL
Peak HR: 144 {beats}/min
Rest HR: 68 {beats}/min
TID: 0.73

## 2017-01-19 MED ORDER — TECHNETIUM TC 99M TETROFOSMIN IV KIT
13.0000 | PACK | Freq: Once | INTRAVENOUS | Status: AC | PRN
  Administered 2017-01-19: 11.6 via INTRAVENOUS

## 2017-01-19 MED ORDER — TECHNETIUM TC 99M TETROFOSMIN IV KIT
32.7930 | PACK | Freq: Once | INTRAVENOUS | Status: AC | PRN
  Administered 2017-01-19: 32.793 via INTRAVENOUS

## 2017-02-01 ENCOUNTER — Other Ambulatory Visit: Payer: Self-pay | Admitting: Family Medicine

## 2017-02-25 ENCOUNTER — Ambulatory Visit: Payer: BLUE CROSS/BLUE SHIELD | Admitting: Internal Medicine

## 2017-03-11 ENCOUNTER — Ambulatory Visit: Payer: BLUE CROSS/BLUE SHIELD | Admitting: Family Medicine
